# Patient Record
Sex: Female | Born: 1950
Health system: Southern US, Community
[De-identification: ages and names within clinical notes are randomized; demographics above are authoritative.]

## PROBLEM LIST (undated history)

## (undated) DIAGNOSIS — C50919 Malignant neoplasm of unspecified site of unspecified female breast: Secondary | ICD-10-CM

## (undated) DIAGNOSIS — K589 Irritable bowel syndrome without diarrhea: Secondary | ICD-10-CM

## (undated) DIAGNOSIS — F32A Depression, unspecified: Secondary | ICD-10-CM

## (undated) DIAGNOSIS — J029 Acute pharyngitis, unspecified: Secondary | ICD-10-CM

## (undated) DIAGNOSIS — M1990 Unspecified osteoarthritis, unspecified site: Secondary | ICD-10-CM

## (undated) DIAGNOSIS — R059 Cough, unspecified: Secondary | ICD-10-CM

## (undated) DIAGNOSIS — K219 Gastro-esophageal reflux disease without esophagitis: Secondary | ICD-10-CM

## (undated) DIAGNOSIS — R05 Cough: Secondary | ICD-10-CM

## (undated) DIAGNOSIS — F329 Major depressive disorder, single episode, unspecified: Secondary | ICD-10-CM

## (undated) HISTORY — PX: BREAST LUMPECTOMY: SHX2

## (undated) HISTORY — DX: Unspecified osteoarthritis, unspecified site: M19.90

## (undated) HISTORY — DX: Acute pharyngitis, unspecified: J02.9

## (undated) HISTORY — DX: Cough: R05

## (undated) HISTORY — PX: DILATION AND CURETTAGE OF UTERUS: SHX78

## (undated) HISTORY — DX: Malignant neoplasm of unspecified site of unspecified female breast: C50.919

## (undated) HISTORY — PX: COLONOSCOPY: SHX174

## (undated) HISTORY — DX: Cough, unspecified: R05.9

---

## 1998-04-10 ENCOUNTER — Other Ambulatory Visit: Admission: RE | Admit: 1998-04-10 | Discharge: 1998-04-10 | Payer: Self-pay | Admitting: Family Medicine

## 1999-04-16 ENCOUNTER — Other Ambulatory Visit: Admission: RE | Admit: 1999-04-16 | Discharge: 1999-04-16 | Payer: Self-pay | Admitting: Obstetrics and Gynecology

## 2000-05-19 ENCOUNTER — Other Ambulatory Visit: Admission: RE | Admit: 2000-05-19 | Discharge: 2000-05-19 | Payer: Self-pay | Admitting: Obstetrics and Gynecology

## 2001-06-08 ENCOUNTER — Other Ambulatory Visit: Admission: RE | Admit: 2001-06-08 | Discharge: 2001-06-08 | Payer: Self-pay | Admitting: Obstetrics and Gynecology

## 2002-06-15 ENCOUNTER — Other Ambulatory Visit: Admission: RE | Admit: 2002-06-15 | Discharge: 2002-06-15 | Payer: Self-pay | Admitting: Obstetrics and Gynecology

## 2003-07-02 ENCOUNTER — Other Ambulatory Visit: Admission: RE | Admit: 2003-07-02 | Discharge: 2003-07-02 | Payer: Self-pay | Admitting: Obstetrics and Gynecology

## 2004-07-08 ENCOUNTER — Other Ambulatory Visit: Admission: RE | Admit: 2004-07-08 | Discharge: 2004-07-08 | Payer: Self-pay | Admitting: Obstetrics and Gynecology

## 2004-12-26 HISTORY — PX: FACELIFT: SHX1566

## 2005-01-04 ENCOUNTER — Ambulatory Visit (HOSPITAL_COMMUNITY): Admission: RE | Admit: 2005-01-04 | Discharge: 2005-01-04 | Payer: Self-pay | Admitting: Plastic Surgery

## 2005-07-14 ENCOUNTER — Other Ambulatory Visit: Admission: RE | Admit: 2005-07-14 | Discharge: 2005-07-14 | Payer: Self-pay | Admitting: Obstetrics and Gynecology

## 2009-10-14 ENCOUNTER — Encounter: Admission: RE | Admit: 2009-10-14 | Discharge: 2009-10-14 | Payer: Self-pay | Admitting: Family Medicine

## 2011-10-26 ENCOUNTER — Other Ambulatory Visit: Payer: Self-pay | Admitting: Obstetrics and Gynecology

## 2011-10-26 DIAGNOSIS — R928 Other abnormal and inconclusive findings on diagnostic imaging of breast: Secondary | ICD-10-CM

## 2011-11-02 ENCOUNTER — Other Ambulatory Visit: Payer: Self-pay | Admitting: Obstetrics and Gynecology

## 2011-11-02 ENCOUNTER — Ambulatory Visit
Admission: RE | Admit: 2011-11-02 | Discharge: 2011-11-02 | Disposition: A | Discharge status: Home / Self Care | Payer: BC Managed Care – PPO | Source: Ambulatory Visit | Attending: Obstetrics and Gynecology | Admitting: Obstetrics and Gynecology

## 2011-11-02 DIAGNOSIS — R921 Mammographic calcification found on diagnostic imaging of breast: Secondary | ICD-10-CM

## 2011-11-02 DIAGNOSIS — R928 Other abnormal and inconclusive findings on diagnostic imaging of breast: Secondary | ICD-10-CM

## 2011-11-09 ENCOUNTER — Ambulatory Visit
Admission: RE | Admit: 2011-11-09 | Discharge: 2011-11-09 | Disposition: A | Discharge status: Home / Self Care | Payer: BC Managed Care – PPO | Source: Ambulatory Visit | Attending: Obstetrics and Gynecology | Admitting: Obstetrics and Gynecology

## 2011-11-09 ENCOUNTER — Other Ambulatory Visit: Payer: Self-pay | Admitting: Obstetrics and Gynecology

## 2011-11-09 DIAGNOSIS — R921 Mammographic calcification found on diagnostic imaging of breast: Secondary | ICD-10-CM

## 2011-11-09 DIAGNOSIS — Z9889 Other specified postprocedural states: Secondary | ICD-10-CM

## 2011-11-10 ENCOUNTER — Ambulatory Visit
Admission: RE | Admit: 2011-11-10 | Discharge: 2011-11-10 | Disposition: A | Discharge status: Home / Self Care | Payer: BC Managed Care – PPO | Source: Ambulatory Visit | Attending: Obstetrics and Gynecology | Admitting: Obstetrics and Gynecology

## 2011-11-10 DIAGNOSIS — Z9889 Other specified postprocedural states: Secondary | ICD-10-CM

## 2011-11-16 ENCOUNTER — Encounter (INDEPENDENT_AMBULATORY_CARE_PROVIDER_SITE_OTHER): Payer: Self-pay | Admitting: General Surgery

## 2011-11-16 ENCOUNTER — Other Ambulatory Visit (INDEPENDENT_AMBULATORY_CARE_PROVIDER_SITE_OTHER): Payer: Self-pay | Admitting: General Surgery

## 2011-11-16 ENCOUNTER — Ambulatory Visit (INDEPENDENT_AMBULATORY_CARE_PROVIDER_SITE_OTHER): Payer: BC Managed Care – PPO | Admitting: General Surgery

## 2011-11-16 VITALS — BP 122/80 | HR 68 | Temp 97.9°F | Resp 18 | Ht 63.25 in | Wt 140.2 lb

## 2011-11-16 DIAGNOSIS — D4861 Neoplasm of uncertain behavior of right breast: Secondary | ICD-10-CM

## 2011-11-16 DIAGNOSIS — D4860 Neoplasm of uncertain behavior of unspecified breast: Secondary | ICD-10-CM

## 2011-11-16 NOTE — Patient Instructions (Signed)
You have a small area of calcifications in the upper outer quadrant of the right breast, and the biopsy shows atypical ductal hyperplasia.  You will be scheduled for a conservative biopsy of this area.  Breast Biopsy WHY YOU NEED A BIOPSY Your caregiver has recommended that you have a breast tissue sample taken (biopsy). This is done to be certain that the lump or abnormality found in your breast is not cancerous (malignant). During a biopsy, a small piece of tissue is removed, so it can be examined under a microscope by a specialist (pathologist) who looks at tissues and cells and diagnoses abnormalities in them. Most lumps (tumors) or abnormalities, on or in the breast, are not cancerous (benign). However, biopsies are taken when your caregiver cannot be absolutely certain of what is wrong only from doing a physical exam, mammogram (breast X-ray), or other studies. A breast biopsy can tell you whether nothing more needs to be done, or you need more surgery or another type of treatment. A biopsy is done when there is:  Any undiagnosed breast mass.   Nipple abnormalities, dimpling, crusting, or ulcerations.   Calcium deposits (calcifications) or abnormalities seen on your mammogram, ultrasound, or MRI.   Suspicious changes in the breast (thickening, asymmetry) seen on mammogram.   Abnormal discharge from the nipple, especially blood.   Redness, swelling, and pain of the breast.  HOW A BIOPSY IS PERFORMED A biopsy is often performed on an outpatient basis (you go home the same day). This can be done in a hospital, clinic, or surgical center. Tissue samples (biopsies) are often done under local anesthesia (area is numbed). Sometimes general anesthetics are required, in which case you sleep through the procedure. Biopsies may remove the entire lump, a small piece of the lump, or a small sliver of tissue removed by needle. TYPES OF BREAST BIOPSY  Fine needle aspiration. A thin needle is placed  through the skin, to the lump or cyst, and cells are removed.   Core needle biopsy. A large needle with a special tip is placed through the skin, to the abnormality, and a piece of tissue is removed.   Stereotactic biopsy. A core needle with a special X-ray is used, to direct the needle to the lump or abnormal area, which is difficult to feel or cannot be felt.   Vacuum-assisted biopsy. A hollow probe and a gentle vacuum remove a sample of tissue.   Ultrasound guided core needle biopsy. You lie on your stomach, with your breast through an opening, and a high frequency ultrasound helps guide the needle to the area of the abnormality.   Open biopsy. An incision is made in the breast, and a piece of the lump or the whole lump is removed.  LET YOUR CAREGIVER KNOW ABOUT:  Allergies.   Medicines taken, including herbs, eye drops, over-the-counter medicines, and creams.   Use of steroids (by mouth or creams).   Previous problems with anesthetics or Novocaine.   If you are taking aspirin or blood thinners.   Possibility of pregnancy, if this applies.   History of blood clots (thrombophlebitis).   History of bleeding or blood problems.   Previous surgery.   Other health problems.  RISKS AND COMPLICATIONS   Bleeding.   Infection.   Allergy to medicines.   Bruising and swelling of the breast.   Alteration in the shape of the breast.   Not finding the lump or abnormality.   Needing more surgery.  BEFORE THE PROCEDURE  You should arrive 60 minutes prior to your procedure or as directed.   Check-in at the admissions desk, to fill out necessary forms, if you are not preregistered.   There will be consent forms to sign, prior to the procedure.   There is a waiting area for your family, while you are having your biopsy.   Try to have someone with you, to drive you home.   Do not smoke for 2 weeks before the surgery.   Let your caregiver know if you develop a cold or an  infection.   Do not drink alcohol for at least 24 hours before surgery.   Wear a good support bra to the surgery.  AFTER THE PROCEDURE  After surgery, you will be taken to the recovery area, where a nurse will watch and check your progress. Once you are awake, stable, and taking fluids well, if there are no other problems, you will be allowed to go home.   Ice packs applied to your operative site may help with discomfort and keep the swelling down.   You may resume normal diet and activities as directed. Avoid strenuous activities affecting the arm on the side of the biopsy, such as tennis, swimming, heavy lifting (more than 10 pounds) or pulling.   Bruising in the breast is normal following this procedure.   Wearing a support bra, even to bed, may be more comfortable. The bra will also help keep the dressing on.   Change dressings as directed.   Your doctor may apply a pressure dressing on your breast for 24 to 48 hours.   Only take over-the-counter or prescription medicines for pain, discomfort, or fever as directed by your caregiver.   Do not take aspirin, because it can cause bleeding.  HOME CARE INSTRUCTIONS   You may resume your usual diet.   Have someone drive you home after the surgery.   Do not do any exercise, driving, lifting or general activities without your caregiver's permission.   Take medicines and over-the-counter medicines, as ordered by your caregiver.   Keep your postoperative appointments as recommended.   Do not drink alcohol while taking pain medicine.  Finding out the results of your test Not all test results are available during your visit. If your test results are not back during the visit, make an appointment with your caregiver to find out the results. Do not assume everything is normal if you have not heard from your caregiver or the medical facility. It is important for you to follow up on all of your test results.  SEEK MEDICAL CARE IF:   You  notice redness, swelling, or increasing pain in the wound.   You notice a bad smell coming from the wound or dressing.   You develop a rash.   You need stronger pain medicine.   You are having an allergic reaction or problems with your medicines.  SEEK IMMEDIATE MEDICAL CARE IF:   You have difficulty breathing.   You have a fever.   There is increased bleeding (more than a small spot) from the wound.   Pus is coming from the wound.   The wound is breaking open.  Document Released: 09/13/2005 Document Revised: 05/26/2011 Document Reviewed: 08/01/2009 [facility] Patient Information 2012 [place], [place].

## 2011-11-16 NOTE — Progress Notes (Signed)
Patient ID: Patricia Hale, female   DOB: June 17, 1951, 61 y.o.   MRN: [id number]  Chief Complaint  Patient presents with  . New Evaluation    Right adhesions     HPI Patricia Hale is a 61 y.o. female.  She is referred by Dr. Norva Pavlov at the breast [facility] for consideration of excision microcalcifications upper outer quadrant right breast.  The patient does not have any history of breast problems. Routine screening mammograms recently showed a small area of focal microcalcifications in the upper outer quadrant of the right breast. There is no mass effect. Left breast looked fine. 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 She was referred for excision of this area.  Family history reveals breast cancer in a paternal aunt only. There is no family history of ovarian cancer.  The patient does take hormone replacement therapy. She is a smoker. She is employed as a Interior and spatial designer. HPI  Past Medical History  Diagnosis Date  . Arthritis     osteo  . Cough   . Sore throat     slight per patient history form dated 11/16/2011    Past Surgical History  Procedure Date  . Facelift 12/2004    Family History  Problem Relation Age of Onset  . Cancer Father   . Cancer Paternal Aunt     breast    Social History History  Substance Use Topics  . Smoking status: Current Everyday Smoker -- 0.5 packs/day    Types: Cigarettes  . Smokeless tobacco: Never Used  . Alcohol Use: Yes     2 glasses per day    No Known Allergies  Current Outpatient Prescriptions  Medication Sig Dispense Refill  . escitalopram (LEXAPRO) 10 MG tablet Take 10 mg by mouth daily.      Marland Kitchen estradiol-norethindrone (MIMVEY) 1-0.5 MG per tablet Take 1 tablet by mouth daily.      . hyoscyamine (LEVSIN, ANASPAZ) 0.125 MG tablet Take 0.125 mg by mouth daily.        Review of Systems Review of Systems  Constitutional: Negative for fever, chills and unexpected weight change.  HENT: Negative  for hearing loss, congestion, sore throat, trouble swallowing and voice change.   Eyes: Negative for visual disturbance.  Respiratory: Negative for cough and wheezing.   Cardiovascular: Negative for chest pain, palpitations and leg swelling.  Gastrointestinal: Negative for nausea, vomiting, abdominal pain, diarrhea, constipation, blood in stool, abdominal distention and anal bleeding.  Genitourinary: Negative for hematuria, vaginal bleeding and difficulty urinating.  Musculoskeletal: Negative for arthralgias.  Skin: Negative for rash and wound.  Neurological: Negative for seizures, syncope and headaches.  Hematological: Negative for adenopathy. Does not bruise/bleed easily.  Psychiatric/Behavioral: Negative for confusion.    Blood pressure 122/80, pulse 68, temperature 97.9 F (36.6 C), temperature source Temporal, resp. rate 18, height 5' 3.25" (1.607 m), weight 140 lb 3.2 oz (63.594 kg).  Physical Exam Physical Exam  Constitutional: She is oriented to person, place, and time. She appears well-developed and well-nourished. No distress.  HENT:  Head: Normocephalic and atraumatic.  Nose: Nose normal.  Mouth/Throat: No oropharyngeal exudate.  Eyes: Conjunctivae and EOM are normal. Pupils are equal, round, and reactive to light. Left eye exhibits no discharge. No scleral icterus.  Neck: Neck supple. No JVD present. No tracheal deviation present. No thyromegaly present.  Cardiovascular: Normal rate, regular rhythm, normal heart sounds and intact distal pulses.   No murmur heard. Pulmonary/Chest: Effort normal and breath sounds normal. No respiratory  distress. She has no wheezes. She has no rales. She exhibits no tenderness.       Small bruise and biopsy site upper outer quadrant right breast. No mass in either breast. No other skin change. Nipples normal. No axillary adenopathy.  Abdominal: Soft. Bowel sounds are normal. She exhibits no distension and no mass. There is no tenderness. There  is no rebound and no guarding.  Musculoskeletal: She exhibits no edema and no tenderness.  Lymphadenopathy:    She has no cervical adenopathy.  Neurological: She is alert and oriented to person, place, and time. She exhibits normal muscle tone. Coordination normal.  Skin: Skin is warm. No rash noted. She is not diaphoretic. No erythema. No pallor.  Psychiatric: She has a normal mood and affect. Her behavior is normal. Judgment and thought content normal.    Data Reviewed I reviewed the mammograms and biopsy report.  Assessment    Atypical ductal hyperplasia right breast, upper outer quadrant. Small focal area of microcalcifications.    Plan    We'll schedule for right breast biopsy with needle localization and specimen mammogram in the near future.  I've discussed the indications and details of the surgery with her. Risks and complications have been outlined, including but limited to bleeding, infection, cosmetic deformity, nerve damage with chronic pain, reoperation for positive margins or if cancer is found, cardiac, pulmonary, and thromboembolic problems. She understands the issues. Her questions were answered. She agrees with this plan.       [name]. Derrell Lolling, M.D., [facility] General and Minimally invasive Surgery Breast and Colorectal Surgery Office:   [phone] Pager:   [phone]  11/16/2011, 10:48 AM

## 2011-12-08 ENCOUNTER — Encounter (HOSPITAL_BASED_OUTPATIENT_CLINIC_OR_DEPARTMENT_OTHER): Payer: Self-pay | Admitting: *Deleted

## 2011-12-08 NOTE — Progress Notes (Signed)
No labs needed

## 2011-12-10 NOTE — H&P (Signed)
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   MRN: [id number]          HPI Patricia Hale is a 61 y.o. female.  She is referred by Dr. Norva Pavlov at the breast [facility] for consideration of excision microcalcifications upper outer quadrant right breast.  The patient does not have any history of breast problems. Routine screening mammograms recently showed a small area of focal microcalcifications in the upper outer quadrant of the right breast. There is no mass effect. Left breast looked fine. 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 She was referred for excision of this area.  Family history reveals breast cancer in a paternal aunt only. There is no family history of ovarian cancer.  The patient does take hormone replacement therapy. She is a smoker. She is employed as a Interior and spatial designer.    Past Medical History   Diagnosis  Date   .  Arthritis         osteo   .  Cough     .  Sore throat         slight per patient history form dated 11/16/2011       Past Surgical History   Procedure  Date   .  Facelift  12/2004       Family History   Problem  Relation  Age of Onset   .  Cancer  Father     .  Cancer  Paternal Aunt         breast      Social History History   Substance Use Topics   .  Smoking status:  Current Everyday Smoker -- 0.5 packs/day       Types:  Cigarettes   .  Smokeless tobacco:  Never Used   .  Alcohol Use:  Yes         2 glasses per day       No Known Allergies    Current Outpatient Prescriptions   Medication  Sig  Dispense  Refill   .  escitalopram (LEXAPRO) 10 MG tablet  Take 10 mg by mouth daily.         Marland Kitchen  estradiol-norethindrone (MIMVEY) 1-0.5 MG per tablet  Take 1 tablet by mouth daily.         .  hyoscyamine (LEVSIN, ANASPAZ) 0.125 MG tablet  Take 0.125 mg by mouth daily.            Review of Systems  Constitutional: Negative for fever, chills and unexpected weight change.  HENT: Negative for hearing loss, congestion, sore throat, trouble swallowing and voice change.   Eyes: Negative for visual disturbance.  Respiratory: Negative for cough and wheezing.  Cardiovascular: Negative for chest pain, palpitations and leg swelling.  Gastrointestinal: Negative for nausea, vomiting, abdominal pain, diarrhea, constipation, blood in stool, abdominal distention and anal bleeding.  Genitourinary: Negative for hematuria, vaginal bleeding and difficulty urinating.  Musculoskeletal: Negative for arthralgias.  Skin: Negative for rash and wound.  Neurological: Negative for seizures, syncope and headaches.  Hematological: Negative for adenopathy. Does not bruise/bleed easily.  Psychiatric/Behavioral: Negative for confusion.    Blood pressure 122/80, pulse 68, temperature 97.9 F (36.6 C), temperature source Temporal, resp. rate 18, height 5' 3.25" (1.607 m), weight 140 lb 3.2 oz (63.594 kg).   Physical Exam  Constitutional: She is oriented to person, place, and time. She appears well-developed and well-nourished. No distress.  HENT:   Head: Normocephalic and atraumatic.   Nose: Nose normal.   Mouth/Throat: No oropharyngeal exudate.  Eyes: Conjunctivae and EOM are normal. Pupils are equal, round, and reactive to light. Left eye exhibits no discharge. No scleral icterus.  Neck: Neck supple. No JVD present. No tracheal deviation present. No thyromegaly present.  Cardiovascular: Normal rate, regular rhythm, normal heart  sounds and intact distal pulses.    No murmur heard. Pulmonary/Chest: Effort normal and breath sounds normal. No respiratory distress. She has no wheezes. She has no rales. She exhibits no tenderness.       Small bruise and biopsy site upper outer quadrant right breast. No mass in either breast. No other skin change. Nipples normal. No axillary adenopathy.  Abdominal: Soft. Bowel sounds are normal. She exhibits no distension and no mass. There is no tenderness. There is no rebound and no guarding.  Musculoskeletal: She exhibits no edema and no tenderness.  Lymphadenopathy:    She has no cervical adenopathy.  Neurological: She is alert and oriented to person, place, and time. She exhibits normal muscle tone. Coordination normal.  Skin: Skin is warm. No rash noted. She is not diaphoretic. No erythema. No pallor.  Psychiatric: She has a normal mood and affect. Her behavior is normal. Judgment and thought content normal.     Assessment Atypical ductal hyperplasia right breast, upper outer quadrant. Small focal area of microcalcifications.   Plan We'll schedule for right breast biopsy with needle localization and specimen mammogram in the near future.  I've discussed the indications and details of the surgery with her. Risks and complications have been outlined, including but limited to bleeding, infection, cosmetic deformity, nerve damage with chronic pain, reoperation for positive margins or if cancer is found, cardiac, pulmonary, and thromboembolic problems. She understands the issues. Her questions were answered. She agrees with this plan.       [name]. Derrell Lolling, M.D., [facility] General and Minimally invasive Surgery Breast and Colorectal Surgery Office:   [phone] Pager:   [phone]

## 2011-12-13 ENCOUNTER — Ambulatory Visit
Admission: RE | Admit: 2011-12-13 | Discharge: 2011-12-13 | Disposition: A | Discharge status: Home / Self Care | Payer: BC Managed Care – PPO | Source: Ambulatory Visit | Attending: General Surgery | Admitting: General Surgery

## 2011-12-13 ENCOUNTER — Encounter (HOSPITAL_BASED_OUTPATIENT_CLINIC_OR_DEPARTMENT_OTHER)
Admission: RE | Disposition: A | Discharge status: Home / Self Care | Payer: Self-pay | Source: Ambulatory Visit | Attending: General Surgery

## 2011-12-13 ENCOUNTER — Ambulatory Visit (HOSPITAL_BASED_OUTPATIENT_CLINIC_OR_DEPARTMENT_OTHER)
Admission: RE | Admit: 2011-12-13 | Discharge: 2011-12-13 | Disposition: A | Discharge status: Home / Self Care | Payer: BC Managed Care – PPO | Source: Ambulatory Visit | Attending: General Surgery | Admitting: General Surgery

## 2011-12-13 ENCOUNTER — Encounter (HOSPITAL_BASED_OUTPATIENT_CLINIC_OR_DEPARTMENT_OTHER): Payer: Self-pay | Admitting: Anesthesiology

## 2011-12-13 ENCOUNTER — Ambulatory Visit (HOSPITAL_COMMUNITY): Payer: BC Managed Care – PPO

## 2011-12-13 ENCOUNTER — Encounter (HOSPITAL_BASED_OUTPATIENT_CLINIC_OR_DEPARTMENT_OTHER): Payer: Self-pay

## 2011-12-13 ENCOUNTER — Ambulatory Visit (HOSPITAL_BASED_OUTPATIENT_CLINIC_OR_DEPARTMENT_OTHER): Payer: BC Managed Care – PPO | Admitting: Anesthesiology

## 2011-12-13 DIAGNOSIS — D0590 Unspecified type of carcinoma in situ of unspecified breast: Secondary | ICD-10-CM | POA: Insufficient documentation

## 2011-12-13 DIAGNOSIS — C50919 Malignant neoplasm of unspecified site of unspecified female breast: Secondary | ICD-10-CM

## 2011-12-13 DIAGNOSIS — D4861 Neoplasm of uncertain behavior of right breast: Secondary | ICD-10-CM

## 2011-12-13 DIAGNOSIS — Z9889 Other specified postprocedural states: Secondary | ICD-10-CM

## 2011-12-13 HISTORY — DX: Irritable bowel syndrome, unspecified: K58.9

## 2011-12-13 HISTORY — PX: BREAST SURGERY: SHX581

## 2011-12-13 HISTORY — DX: Major depressive disorder, single episode, unspecified: F32.9

## 2011-12-13 HISTORY — DX: Malignant neoplasm of unspecified site of unspecified female breast: C50.919

## 2011-12-13 HISTORY — DX: Depression, unspecified: F32.A

## 2011-12-13 SURGERY — PARTIAL MASTECTOMY WITH NEEDLE LOCALIZATION
Anesthesia: General | Site: Breast | Laterality: Right | Wound class: Clean

## 2011-12-13 MED ORDER — HYDROMORPHONE HCL PF 1 MG/ML IJ SOLN
0.2500 mg | INTRAMUSCULAR | Status: DC | PRN
Start: 2011-12-13 — End: 2011-12-13

## 2011-12-13 MED ORDER — FENTANYL CITRATE 0.05 MG/ML IJ SOLN
INTRAMUSCULAR | Status: DC | PRN
  Administered 2011-12-13: 50 ug via INTRAVENOUS

## 2011-12-13 MED ORDER — MIDAZOLAM HCL 5 MG/5ML IJ SOLN
INTRAMUSCULAR | Status: DC | PRN
  Administered 2011-12-13: 2 mg via INTRAVENOUS

## 2011-12-13 MED ORDER — ONDANSETRON HCL 4 MG/2ML IJ SOLN
INTRAMUSCULAR | Status: DC | PRN
  Administered 2011-12-13: 4 mg via INTRAVENOUS

## 2011-12-13 MED ORDER — ONDANSETRON HCL 4 MG/2ML IJ SOLN: 4.0000 mg | Freq: Four times a day (QID) | INTRAMUSCULAR | Status: DC | PRN

## 2011-12-13 MED ORDER — SODIUM CHLORIDE 0.9 % IV SOLN: INTRAVENOUS | Status: DC

## 2011-12-13 MED ORDER — ACETAMINOPHEN 325 MG PO TABS: 650.0000 mg | ORAL_TABLET | ORAL | Status: DC | PRN

## 2011-12-13 MED ORDER — CHLORHEXIDINE GLUCONATE 4 % EX LIQD: 1.0000 "application " | Freq: Once | CUTANEOUS | Status: DC

## 2011-12-13 MED ORDER — MIDAZOLAM HCL 2 MG/2ML IJ SOLN: 1.0000 mg | INTRAMUSCULAR | Status: DC | PRN

## 2011-12-13 MED ORDER — DEXAMETHASONE SODIUM PHOSPHATE 4 MG/ML IJ SOLN
INTRAMUSCULAR | Status: DC | PRN
  Administered 2011-12-13: 10 mg via INTRAVENOUS

## 2011-12-13 MED ORDER — LIDOCAINE HCL (CARDIAC) 20 MG/ML IV SOLN
INTRAVENOUS | Status: DC | PRN
  Administered 2011-12-13: 80 mg via INTRAVENOUS

## 2011-12-13 MED ORDER — CEFAZOLIN SODIUM 1-5 GM-% IV SOLN
1.0000 g | INTRAVENOUS | Status: AC
  Administered 2011-12-13: 1 g via INTRAVENOUS

## 2011-12-13 MED ORDER — OXYCODONE-ACETAMINOPHEN 10-325 MG PO TABS: 1.0000 | ORAL_TABLET | ORAL | Status: AC | PRN

## 2011-12-13 MED ORDER — SODIUM CHLORIDE 0.9 % IJ SOLN: 3.0000 mL | INTRAMUSCULAR | Status: DC | PRN

## 2011-12-13 MED ORDER — LACTATED RINGERS IV SOLN
INTRAVENOUS | Status: DC
  Administered 2011-12-13 (×2): via INTRAVENOUS

## 2011-12-13 MED ORDER — BUPIVACAINE-EPINEPHRINE 0.5% -1:200000 IJ SOLN
INTRAMUSCULAR | Status: DC | PRN
  Administered 2011-12-13: 20 mL

## 2011-12-13 MED ORDER — PROPOFOL 10 MG/ML IV EMUL
INTRAVENOUS | Status: DC | PRN
  Administered 2011-12-13: 200 mg via INTRAVENOUS

## 2011-12-13 MED ORDER — FENTANYL CITRATE 0.05 MG/ML IJ SOLN: 50.0000 ug | INTRAMUSCULAR | Status: DC | PRN

## 2011-12-13 MED ORDER — SODIUM CHLORIDE 0.9 % IJ SOLN: 3.0000 mL | Freq: Two times a day (BID) | INTRAMUSCULAR | Status: DC

## 2011-12-13 MED ORDER — OXYCODONE HCL 5 MG PO TABS: 5.0000 mg | ORAL_TABLET | ORAL | Status: DC | PRN

## 2011-12-13 MED ORDER — EPHEDRINE SULFATE 50 MG/ML IJ SOLN
INTRAMUSCULAR | Status: DC | PRN
  Administered 2011-12-13: 5 mg via INTRAVENOUS

## 2011-12-13 MED ORDER — MORPHINE SULFATE 2 MG/ML IJ SOLN: 2.0000 mg | INTRAMUSCULAR | Status: DC | PRN

## 2011-12-13 MED ORDER — SODIUM CHLORIDE 0.9 % IV SOLN: 250.0000 mL | INTRAVENOUS | Status: DC | PRN

## 2011-12-13 MED ORDER — ACETAMINOPHEN 650 MG RE SUPP: 650.0000 mg | RECTAL | Status: DC | PRN

## 2011-12-13 MED ORDER — LORAZEPAM 2 MG/ML IJ SOLN: 1.0000 mg | Freq: Once | INTRAMUSCULAR | Status: DC | PRN

## 2011-12-13 SURGICAL SUPPLY — 56 items
BANDAGE ELASTIC 6 VELCRO ST LF (GAUZE/BANDAGES/DRESSINGS) IMPLANT
BENZOIN TINCTURE PRP APPL 2/3 (GAUZE/BANDAGES/DRESSINGS) IMPLANT
BLADE HEX COATED 2.75 (ELECTRODE) ×2 IMPLANT
BLADE SURG 15 STRL LF DISP TIS (BLADE) ×2 IMPLANT
BLADE SURG 15 STRL SS (BLADE) ×2
CANISTER SUCTION 1200CC (MISCELLANEOUS) ×2 IMPLANT
CHLORAPREP W/TINT 26ML (MISCELLANEOUS) ×2 IMPLANT
CLOTH BEACON ORANGE TIMEOUT ST (SAFETY) ×2 IMPLANT
COVER MAYO STAND STRL (DRAPES) ×2 IMPLANT
COVER TABLE BACK 60X90 (DRAPES) ×2 IMPLANT
DECANTER SPIKE VIAL GLASS SM (MISCELLANEOUS) IMPLANT
DERMABOND ADVANCED (GAUZE/BANDAGES/DRESSINGS) ×1
DERMABOND ADVANCED .7 DNX12 (GAUZE/BANDAGES/DRESSINGS) ×1 IMPLANT
DEVICE DUBIN W/COMP PLATE 8390 (MISCELLANEOUS) ×2 IMPLANT
DRAPE LAPAROTOMY TRNSV 102X78 (DRAPE) IMPLANT
DRAPE PED LAPAROTOMY (DRAPES) ×2 IMPLANT
DRAPE UTILITY XL STRL (DRAPES) ×2 IMPLANT
ELECT REM PT RETURN 9FT ADLT (ELECTROSURGICAL) ×2
ELECTRODE REM PT RTRN 9FT ADLT (ELECTROSURGICAL) ×1 IMPLANT
GAUZE SPONGE 4X4 12PLY STRL LF (GAUZE/BANDAGES/DRESSINGS) IMPLANT
GAUZE SPONGE 4X4 16PLY XRAY LF (GAUZE/BANDAGES/DRESSINGS) IMPLANT
GLOVE BIO SURGEON STRL SZ 6.5 (GLOVE) ×2 IMPLANT
GLOVE BIOGEL PI IND STRL 7.0 (GLOVE) ×1 IMPLANT
GLOVE BIOGEL PI IND STRL 8 (GLOVE) ×1 IMPLANT
GLOVE BIOGEL PI INDICATOR 7.0 (GLOVE) ×1
GLOVE BIOGEL PI INDICATOR 8 (GLOVE) ×1
GLOVE EUDERMIC 7 POWDERFREE (GLOVE) ×2 IMPLANT
GLOVE SURG SS PI 8.0 STRL IVOR (GLOVE) ×2 IMPLANT
GOWN PREVENTION PLUS XLARGE (GOWN DISPOSABLE) ×2 IMPLANT
GOWN PREVENTION PLUS XXLARGE (GOWN DISPOSABLE) ×4 IMPLANT
KIT MARKER MARGIN INK (KITS) ×2 IMPLANT
NEEDLE HYPO 22GX1.5 SAFETY (NEEDLE) IMPLANT
NEEDLE HYPO 25X1 1.5 SAFETY (NEEDLE) ×2 IMPLANT
NS IRRIG 1000ML POUR BTL (IV SOLUTION) ×2 IMPLANT
PACK BASIN DAY SURGERY FS (CUSTOM PROCEDURE TRAY) ×2 IMPLANT
PENCIL BUTTON HOLSTER BLD 10FT (ELECTRODE) ×2 IMPLANT
SLEEVE SCD COMPRESS KNEE MED (MISCELLANEOUS) ×2 IMPLANT
SPONGE LAP 4X18 X RAY DECT (DISPOSABLE) ×2 IMPLANT
STRIP CLOSURE SKIN 1/2X4 (GAUZE/BANDAGES/DRESSINGS) IMPLANT
SUT ETHILON 4 0 PS 2 18 (SUTURE) IMPLANT
SUT MON AB 4-0 PC3 18 (SUTURE) ×2 IMPLANT
SUT SILK 2 0 SH (SUTURE) ×2 IMPLANT
SUT VIC AB 2-0 SH 27 (SUTURE)
SUT VIC AB 2-0 SH 27XBRD (SUTURE) IMPLANT
SUT VIC AB 3-0 FS2 27 (SUTURE) IMPLANT
SUT VIC AB 4-0 P-3 18XBRD (SUTURE) IMPLANT
SUT VIC AB 4-0 P3 18 (SUTURE)
SUT VICRYL 3-0 CR8 SH (SUTURE) ×2 IMPLANT
SUT VICRYL 4-0 PS2 18IN ABS (SUTURE) IMPLANT
SYR BULB 3OZ (MISCELLANEOUS) ×2 IMPLANT
SYR CONTROL 10ML LL (SYRINGE) ×2 IMPLANT
TAPE HYPAFIX 4 X10 (GAUZE/BANDAGES/DRESSINGS) IMPLANT
TOWEL OR NON WOVEN STRL DISP B (DISPOSABLE) ×2 IMPLANT
TUBE CONNECTING 20X1/4 (TUBING) ×2 IMPLANT
WATER STERILE IRR 1000ML POUR (IV SOLUTION) IMPLANT
YANKAUER SUCT BULB TIP NO VENT (SUCTIONS) ×2 IMPLANT

## 2011-12-13 NOTE — Interval H&P Note (Signed)
History and Physical Interval Note:  12/13/2011 9:15 AM  Patricia Hale  has presented today for surgery, with the diagnosis of atypical hyperplasia right breast  The goals of treatment and the various methods of treatment have been discussed with the patient and family. After consideration of risks, benefits and other options for treatment, the patient has consented to  Procedure(s) (LRB): PARTIAL MASTECTOMY WITH NEEDLE LOCALIZATION (Right) as a surgical intervention .  The patients' history has been reviewed, patient examined, no change in status, stable for surgery.  I have reviewed the patients' chart and labs.  Questions were answered to the patient's satisfaction.     Ernestene Mention

## 2011-12-13 NOTE — Anesthesia Procedure Notes (Signed)
Procedure Name: LMA Insertion Date/Time: 12/13/2011 9:33 AM Performed by: Burna Cash Pre-anesthesia Checklist: Patient identified, Emergency Drugs available, Suction available and Patient being monitored Patient Re-evaluated:Patient Re-evaluated prior to inductionOxygen Delivery Method: Circle System Utilized Preoxygenation: Pre-oxygenation with 100% oxygen Intubation Type: IV induction Ventilation: Mask ventilation without difficulty LMA: LMA inserted LMA Size: 4.0 Number of attempts: 1 Airway Equipment and Method: bite block Placement Confirmation: positive ETCO2 Tube secured with: Tape Dental Injury: Teeth and Oropharynx as per pre-operative assessment

## 2011-12-13 NOTE — Op Note (Signed)
Patient Name:           Patricia Hale   Date of Surgery:        12/13/2011  Pre op Diagnosis:      Atypical ductal hyperplasia right breast, upper outer quadrant  Post op Diagnosis:    same  Procedure:                 Right partial mastectomy with needle localization  Surgeon:                     [name]. Derrell Lolling, M.D., FACS  Assistant:                      none  Operative Indications:   The patient does not have any history of breast problems. Routine screening mammograms recently showed a small area of focal microcalcifications in the upper outer quadrant of the right breast. There is no mass effect. Left breast looked fine. 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 She was referred for excision of this area. There is no palpable abnormality.   Operative Findings:       The breast tissue that was excised looked basically normal. The specimen mammogram showed the wire and a marker clip in the center of the specimen.  Procedure in Detail:          Following the induction of general LMA anesthesia the patient's right breast was prepped and draped in a sterile fashion. Intravenous antibiotics were given. Surgical time out was performed. The mammograms and the wire localization films were reviewed. 0.5% Marcaine with epinephrine was used as a local infiltration anesthetic. A curvilinear incision was made in the upper outer quadrant of the right breast paralleling the areolar margin. Dissection was carried down into the breast tissue and around the localizing wire. I removed a 3 cm diameter specimen. The specimen was removed and marked with the 6 color ink kit. Marland Kitchen Specimen mammogram confirmed that the wire and the marker clip were within the center of the specimen. The specimen was sent to pathology. Hemostasis was excellent and achieved with electrocautery. The breast tissue were closed in 2 layers with interrupted sutures of 3-0 Vicryl. The skin was closed with a running subcuticular  suture of 4-0 Monocryl and Dermabond. The patient was taken to recovery room in stable condition. EBL 10 cc. Complications none. Counts correct.     [name]. Derrell Lolling, M.D., FACS General and Minimally Invasive Surgery Breast and Colorectal Surgery  12/13/2011 10:13 AM

## 2011-12-13 NOTE — Discharge Instructions (Signed)
[facility] Office Phone Number [phone]  BREAST BIOPSY/ PARTIAL MASTECTOMY: POST OP INSTRUCTIONS  Always review your discharge instruction sheet given to you by the facility where your surgery was performed.  IF YOU HAVE DISABILITY OR FAMILY LEAVE FORMS, YOU MUST BRING THEM TO THE OFFICE FOR PROCESSING.  DO NOT GIVE THEM TO YOUR DOCTOR.  1. A prescription for pain medication may be given to you upon discharge.  Take your pain medication as prescribed, if needed.  If narcotic pain medicine is not needed, then you may take acetaminophen (Tylenol) or ibuprofen (Advil) as needed. 2. Take your usually prescribed medications unless otherwise directed 3. If you need a refill on your pain medication, please contact your pharmacy.  They will contact our office to request authorization.  Prescriptions will not be filled after 5pm or on week-ends. 4. You should eat very light the first 24 hours after surgery, such as soup, crackers, pudding, etc.  Resume your normal diet the day after surgery. 5. Most patients will experience some swelling and bruising in the breast.  Ice packs and a good support bra will help.  Swelling and bruising can take several days to resolve.  6. It is common to experience some constipation if taking pain medication after surgery.  Increasing fluid intake and taking a stool softener will usually help or prevent this problem from occurring.  A mild laxative (Milk of Magnesia or Miralax) should be taken according to package directions if there are no bowel movements after 48 hours. 7. Unless discharge instructions indicate otherwise, you may remove your bandages 24-48 hours after surgery, and you may shower at that time.  You may have steri-strips (small skin tapes) in place directly over the incision.  These strips should be left on the skin for 7-10 days.  If your surgeon used skin glue on the incision, you may shower in 24 hours.  The glue will flake off over the  next 2-3 weeks.  Any sutures or staples will be removed at the office during your follow-up visit. 8. ACTIVITIES:  You may resume regular daily activities (gradually increasing) beginning the next day.  Wearing a good support bra or sports bra minimizes pain and swelling.  You may have sexual intercourse when it is comfortable. a. You may drive when you no longer are taking prescription pain medication, you can comfortably wear a seatbelt, and you can safely maneuver your car and apply brakes. b. RETURN TO WORK:  ______________________________________________________________________________________ 9. You should see your doctor in the office for a follow-up appointment approximately two weeks after your surgery.  Your doctor's nurse will typically make your follow-up appointment when she calls you with your pathology report.  Expect your pathology report 2-3 business days after your surgery.  You may call to check if you do not hear from us after three days. 10. OTHER INSTRUCTIONS: _______________________________________________________________________________________________ _____________________________________________________________________________________________________________________________________ _____________________________________________________________________________________________________________________________________ _____________________________________________________________________________________________________________________________________  WHEN TO CALL YOUR DOCTOR: 1. Fever over 101.0 2. Nausea and/or vomiting. 3. Extreme swelling or bruising. 4. Continued bleeding from incision. 5. Increased pain, redness, or drainage from the incision.  The clinic staff is available to answer your questions during regular business hours.  Please don't hesitate to call and ask to speak to one of the nurses for clinical concerns.  If you have a medical emergency, go to the nearest  emergency room or call 911.  A surgeon from [facility] is always on call at the hospital.  For further questions, please visit [email or web address]    [facility]  [address] [place], [place] [address] [phone]   Post Anesthesia Home Care Instructions  Activity: Get plenty of rest for the remainder of the day. A responsible adult should stay with you for 24 hours following the procedure.  For the next 24 hours, DO NOT: -Drive a car -Operate machinery -Drink alcoholic beverages -Take any medication unless instructed by your physician -Make any legal decisions or sign important papers.  Meals: Start with liquid foods such as gelatin or soup. Progress to regular foods as tolerated. Avoid greasy, spicy, heavy foods. If nausea and/or vomiting occur, drink only clear liquids until the nausea and/or vomiting subsides. Call your physician if vomiting continues.  Special Instructions/Symptoms: Your throat may feel dry or sore from the anesthesia or the breathing tube placed in your throat during surgery. If this causes discomfort, gargle with warm salt water. The discomfort should disappear within 24 hours.   

## 2011-12-13 NOTE — Transfer of Care (Signed)
Immediate Anesthesia Transfer of Care Note  Patient: Patricia Hale  Procedure(s) Performed: Procedure(s) (LRB): PARTIAL MASTECTOMY WITH NEEDLE LOCALIZATION (Right)  Patient Location: PACU  Anesthesia Type: General  Level of Consciousness: sedated  Airway & Oxygen Therapy: Patient Spontanous Breathing and Patient connected to face mask oxygen  Post-op Assessment: Report given to PACU RN and Post -op Vital signs reviewed and stable  Post vital signs: Reviewed and stable  Complications: No apparent anesthesia complications

## 2011-12-13 NOTE — Anesthesia Postprocedure Evaluation (Signed)
  Anesthesia Post-op Note  Patient: Patricia Hale  Procedure(s) Performed: Procedure(s) (LRB): PARTIAL MASTECTOMY WITH NEEDLE LOCALIZATION (Right)  Patient Location: PACU  Anesthesia Type: General  Level of Consciousness: awake  Airway and Oxygen Therapy: Patient Spontanous Breathing  Post-op Pain: mild  Post-op Assessment: Post-op Vital signs reviewed, Patient's Cardiovascular Status Stable, Respiratory Function Stable, Patent Airway, No signs of Nausea or vomiting and Pain level controlled  Post-op Vital Signs: Reviewed and stable  Complications: No apparent anesthesia complications

## 2011-12-13 NOTE — Anesthesia Preprocedure Evaluation (Signed)
Anesthesia Evaluation  Patient identified by MRN, date of birth, ID band Patient awake    Reviewed: Allergy & Precautions, H&P , NPO status , Patient's Chart, lab work & pertinent test results  Airway Mallampati: I TM Distance: >3 FB Neck ROM: Full    Dental   Pulmonary Recent URI , Residual Cough,  breath sounds clear to auscultation  Pulmonary exam normal       Cardiovascular     Neuro/Psych Depression    GI/Hepatic   Endo/Other    Renal/GU      Musculoskeletal   Abdominal   Peds  Hematology   Anesthesia Other Findings   Reproductive/Obstetrics                           Anesthesia Physical Anesthesia Plan  ASA: II  Anesthesia Plan: General   Post-op Pain Management:    Induction: Intravenous  Airway Management Planned: LMA  Additional Equipment:   Intra-op Plan:   Post-operative Plan: Extubation in OR  Informed Consent: I have reviewed the patients History and Physical, chart, labs and discussed the procedure including the risks, benefits and alternatives for the proposed anesthesia with the patient or authorized representative who has indicated his/her understanding and acceptance.     Plan Discussed with: CRNA and Surgeon  Anesthesia Plan Comments:         Anesthesia Quick Evaluation

## 2011-12-15 ENCOUNTER — Telehealth (INDEPENDENT_AMBULATORY_CARE_PROVIDER_SITE_OTHER): Payer: Self-pay | Admitting: General Surgery

## 2011-12-15 ENCOUNTER — Other Ambulatory Visit (INDEPENDENT_AMBULATORY_CARE_PROVIDER_SITE_OTHER): Payer: Self-pay | Admitting: General Surgery

## 2011-12-15 DIAGNOSIS — D0510 Intraductal carcinoma in situ of unspecified breast: Secondary | ICD-10-CM

## 2011-12-15 NOTE — Telephone Encounter (Signed)
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  I discussed this with patient by phone. Will schedule breast MRI and follow-up with me. She is aware that she will likely need further treatment.

## 2011-12-17 ENCOUNTER — Telehealth (INDEPENDENT_AMBULATORY_CARE_PROVIDER_SITE_OTHER): Payer: Self-pay

## 2011-12-17 NOTE — Telephone Encounter (Signed)
Pt home doing well. Pt advised of mri date and po appt date.

## 2011-12-22 ENCOUNTER — Ambulatory Visit
Admission: RE | Admit: 2011-12-22 | Discharge: 2011-12-22 | Disposition: A | Discharge status: Home / Self Care | Payer: BC Managed Care – PPO | Source: Ambulatory Visit | Attending: General Surgery | Admitting: General Surgery

## 2011-12-22 DIAGNOSIS — D0510 Intraductal carcinoma in situ of unspecified breast: Secondary | ICD-10-CM

## 2011-12-22 MED ORDER — GADOBENATE DIMEGLUMINE 529 MG/ML IV SOLN
13.0000 mL | Freq: Once | INTRAVENOUS | Status: AC | PRN
  Administered 2011-12-22: 13 mL via INTRAVENOUS

## 2011-12-27 ENCOUNTER — Telehealth (INDEPENDENT_AMBULATORY_CARE_PROVIDER_SITE_OTHER): Payer: Self-pay

## 2011-12-27 NOTE — Telephone Encounter (Signed)
Pt notified of MRI result and given po appt.

## 2012-01-04 ENCOUNTER — Encounter (INDEPENDENT_AMBULATORY_CARE_PROVIDER_SITE_OTHER): Payer: BC Managed Care – PPO | Admitting: General Surgery

## 2012-02-04 ENCOUNTER — Encounter (INDEPENDENT_AMBULATORY_CARE_PROVIDER_SITE_OTHER): Payer: Self-pay | Admitting: General Surgery

## 2012-02-04 ENCOUNTER — Ambulatory Visit (INDEPENDENT_AMBULATORY_CARE_PROVIDER_SITE_OTHER): Payer: BC Managed Care – PPO | Admitting: General Surgery

## 2012-02-04 ENCOUNTER — Other Ambulatory Visit (INDEPENDENT_AMBULATORY_CARE_PROVIDER_SITE_OTHER): Payer: Self-pay

## 2012-02-04 VITALS — BP 100/68 | HR 86 | Temp 99.4°F | Resp 16 | Ht 63.5 in | Wt 138.6 lb

## 2012-02-04 DIAGNOSIS — D0510 Intraductal carcinoma in situ of unspecified breast: Secondary | ICD-10-CM

## 2012-02-04 DIAGNOSIS — D0591 Unspecified type of carcinoma in situ of right breast: Secondary | ICD-10-CM

## 2012-02-04 NOTE — Patient Instructions (Signed)
Your recent right breast partial mastectomy final pathology report shows low-grade ductal carcinoma in situ. Margins are negative and so no further surgery will be necessary. Hormone receptors are strongly positive.  You are advised to discontinue all hormone replacement therapy now.  You will be referred to medical oncology and radiation oncology for discussions about the advantages and disadvantages of radiation therapy and antiestrogen therapy.  Return to see Dr. Derrell Lolling in 2 months.

## 2012-02-04 NOTE — Progress Notes (Signed)
Subjective:     Patient ID: Patricia Hale, female   DOB: 11-10-1950, 61 y.o.   MRN: [id number]  HPI This 61 year old Caucasian female underwent right partial mastectomy with needle localization on December 13, 2011. 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 Final pathology showed low-grade ductal carcinoma in situ, 2 mm tumor, margins are negative, receptors are strongly positive.  I discussed all this with the patient on the phone on March 20. On March 28 she underwent bilateral MRI which shows this is a solitary finding.  She had an appointment with me on April 9, and she she canceled that appointment so that she could take care of her mother who was having surgery in [facility]. Her mother is doing well now.  Family history is negative except for an aunt  with breast cancer in her 54s.  She has not seen medical oncology or radiation oncology yet.  She is not having a problem with her breast.  Review of Systems     Objective:   Physical Exam Patient looks well. Good spirits. No distress. Her  right breast shows the incision the upper outer quadrant is healing very nicely. No complications. Breast contour and nipple projection is normal. Symmetry is very good.    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 Doing well postop right partial mastectomy.    Plan:     The patient was advised to discontinue all hormone replacement therapy now.  The patient will be for referred to medical oncology and radiation oncology to see what their opinion is about adjuvant radiation therapy, adjuvant antiestrogen therapy, and whether she needs one or the other or both.  She will return to see me in 2 months  We will plan bilateral mammograms in January 2014.   [name]. Derrell Lolling, M.D., [facility] General and Minimally invasive Surgery Breast and Colorectal Surgery Office:   [phone] Pager:    [phone]

## 2012-02-09 ENCOUNTER — Telehealth: Payer: Self-pay | Admitting: *Deleted

## 2012-02-09 NOTE — Telephone Encounter (Signed)
Left a message for patient to return my call. 

## 2012-02-11 ENCOUNTER — Telehealth: Payer: Self-pay | Admitting: Oncology

## 2012-02-11 NOTE — Telephone Encounter (Signed)
I left a message to schedule a consult per Dr. Jacinto Halim request.

## 2012-02-14 ENCOUNTER — Telehealth: Payer: Self-pay | Admitting: *Deleted

## 2012-02-14 NOTE — Telephone Encounter (Signed)
Left a message for patient to return my call so I can schedule her to see Med Onc.

## 2012-02-14 NOTE — Telephone Encounter (Signed)
Confirmed 02/22/12 appt w/ pt.  Mailed before appt letter & packet to pt.  Took paperwork to Med Rec to scan.

## 2012-02-15 ENCOUNTER — Encounter: Payer: Self-pay | Admitting: Radiation Oncology

## 2012-02-17 ENCOUNTER — Ambulatory Visit
Admission: RE | Admit: 2012-02-17 | Discharge: 2012-02-17 | Disposition: A | Discharge status: Home / Self Care | Payer: BC Managed Care – PPO | Source: Ambulatory Visit | Attending: Radiation Oncology | Admitting: Radiation Oncology

## 2012-02-17 ENCOUNTER — Encounter: Payer: Self-pay | Admitting: Radiation Oncology

## 2012-02-17 ENCOUNTER — Telehealth: Payer: Self-pay | Admitting: *Deleted

## 2012-02-17 VITALS — BP 118/76 | HR 83 | Temp 98.1°F | Resp 20 | Ht 63.0 in | Wt 136.6 lb

## 2012-02-17 DIAGNOSIS — Z809 Family history of malignant neoplasm, unspecified: Secondary | ICD-10-CM | POA: Insufficient documentation

## 2012-02-17 DIAGNOSIS — F17200 Nicotine dependence, unspecified, uncomplicated: Secondary | ICD-10-CM | POA: Insufficient documentation

## 2012-02-17 DIAGNOSIS — D0590 Unspecified type of carcinoma in situ of unspecified breast: Secondary | ICD-10-CM | POA: Insufficient documentation

## 2012-02-17 DIAGNOSIS — C50919 Malignant neoplasm of unspecified site of unspecified female breast: Secondary | ICD-10-CM

## 2012-02-17 DIAGNOSIS — D0591 Unspecified type of carcinoma in situ of right breast: Secondary | ICD-10-CM

## 2012-02-17 NOTE — Telephone Encounter (Signed)
Left message for patient to return my call to reschedule her.

## 2012-02-17 NOTE — Progress Notes (Signed)
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 female who is seen today for the courtesy of Dr. Derrell Lolling for discussion of possible region therapy following conservative surgery in the management of her low-grade DCIS of the right breast. At the time of a screening mammogram she is found to have a 5 x 3 mm cluster of suspicious microcalcifications within the upper-outer quadrant of the right breast. There were approximately 20 calcifications altogether. Additional views were obtained on 11/02/2011. A stereotactic needle core biopsy on 11/09/2011 revealed atypical ductal hyperplasia with microcalcifications. A clip was placed and it appeared that all calcifications were removed at the time of her stereotactic biopsy. When she returned for wire localization utilizing the clip there were no residual calcifications. Dr. 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 His surgical margin was 0.6 cm. There were associated microcalcifications within the complex sclerosing lesion. Hormone receptor data are pending at the time this dictation.  PREVIOUS RADIATION THERAPY: No   PAST MEDICAL HISTORY:  has a past medical history of Arthritis; Cough; Sore throat; IBS (irritable bowel syndrome); Depression; Breast cancer (12/13/11); and Osteoarthritis.     PAST SURGICAL HISTORY:  Past Surgical History  Procedure Date  . Facelift 12/2004  . Colonoscopy   . Dilation and curettage of uterus   . Breast surgery 12/13/2011   Right Breast Lumpectomy     FAMILY HISTORY: family history includes Cancer in her father and paternal aunt and Diabetes in her cousin and maternal grandmother. Her father died from metastatic cancer of unknown etiology at age 23. Her mother is alive and well at 71.   SOCIAL HISTORY:  reports that she has been smoking Cigarettes.  She has a 10 pack-year smoking history. She has never used smokeless tobacco. She reports that she drinks alcohol. She reports that she does not use illicit drugs. She works as a Interior and spatial designer. Her only child/daughter died in a motor vehicle accident.   ALLERGIES: Review of patient's allergies indicates no known allergies.   MEDICATIONS:  Current Outpatient Prescriptions  Medication Sig Dispense Refill  . escitalopram (LEXAPRO) 10 MG tablet Take 10 mg by mouth daily.      . hyoscyamine (LEVSIN, ANASPAZ) 0.125 MG tablet Take 0.125 mg by mouth daily.         REVIEW OF SYSTEMS:  Pertinent items are noted in HPI.    PHYSICAL EXAM:  height is 5\' 3"  (1.6 m) and weight is 136 lb 9.6 oz (61.961 kg). Her oral temperature is 98.1 F (36.7 C). Her blood pressure is 118/76 and her pulse is 83. Her respiration is 20.   Head and neck examination: Grossly unremarkable. Nodes: Without palpable cervical, supraclavicular, or axillary lymphadenopathy. Chest: Lungs clear. Back: Without spinal or CVA tenderness. Heart: Regular rate rhythm. Breasts: There is a partial mastectomy wound along the upper-outer quadrant of the right breast which is healing well. No masses are appreciated. Left breast without masses or lesions. Abdomen  without hepatomegaly. Extremities without edema.   LABORATORY DATA:  Lab Results  Component Value Date   HGB 14.3 12/13/2011   No results found for this basename: NA, K, CL, CO2   No results found for this basename: ALT, AST, GGT, ALKPHOS, BILITOT      IMPRESSION: Stage 0 (Tis N0 M0)  low-grade DCIS of the right breast. In general terms, explained to  the patient and her husband that her local treatment options include mastectomy versus partial mastectomy with or without radiation therapy. The risk for recurrent disease appears to be related to size of DCIS, nuclear grade, and surgical margins in addition to patient age. The Judith Part prognostic index score is quite low indicating less than 5% chance for local recurrence, and thus minimal if any benefit from adjuvant radiation therapy. We also discussed the role of chemoprevention anti-estrogen therapy, and this can be further discussed with medical oncology consultation. I emphasized the need for her to continue with annual screening mammography, but there is no need for mammography at this time since all the suspicious microcalcifications were removed at the time of her initial core stereotactic biopsy.   PLAN: As discussed above. Continue with annual screening mammography, and follow through with medical oncology consultation.   I spent 60 minutes minutes face to face with the patient and more than 50% of that time was spent in counseling and/or coordination of care.

## 2012-02-17 NOTE — Telephone Encounter (Signed)
Patient called and wants to see Dr. Darnelle Catalan.  Confirmed 02/24/12 appt w/ pt as requested by Dr. Darnelle Catalan.  Emailed Cindy at [facility] to make her aware.

## 2012-02-17 NOTE — Progress Notes (Signed)
Please see the Nurse Progress Note in the MD Initial Consult Encounter for this patient. 

## 2012-02-17 NOTE — Progress Notes (Signed)
Menarche age 61, 45 2, menopause age 61, HRT x 12 yrs, quit 1 1/2 wks ago  Married, works part time

## 2012-02-18 NOTE — Progress Notes (Signed)
Encounter addended by: Glennie Hawk, RN on: 02/18/2012  9:40 AM<BR>     Documentation filed: Charges VN

## 2012-02-22 ENCOUNTER — Other Ambulatory Visit: Payer: BC Managed Care – PPO | Admitting: Lab

## 2012-02-22 ENCOUNTER — Ambulatory Visit: Payer: BC Managed Care – PPO

## 2012-02-22 ENCOUNTER — Ambulatory Visit: Payer: BC Managed Care – PPO | Admitting: Oncology

## 2012-02-23 ENCOUNTER — Other Ambulatory Visit: Payer: Self-pay | Admitting: Oncology

## 2012-02-23 DIAGNOSIS — C50919 Malignant neoplasm of unspecified site of unspecified female breast: Secondary | ICD-10-CM

## 2012-02-24 ENCOUNTER — Ambulatory Visit (HOSPITAL_BASED_OUTPATIENT_CLINIC_OR_DEPARTMENT_OTHER): Payer: BC Managed Care – PPO | Admitting: Oncology

## 2012-02-24 ENCOUNTER — Ambulatory Visit: Payer: BC Managed Care – PPO

## 2012-02-24 VITALS — BP 180/77 | HR 79 | Temp 98.7°F | Ht 63.0 in | Wt 135.8 lb

## 2012-02-24 DIAGNOSIS — D0591 Unspecified type of carcinoma in situ of right breast: Secondary | ICD-10-CM

## 2012-02-24 DIAGNOSIS — D0590 Unspecified type of carcinoma in situ of unspecified breast: Secondary | ICD-10-CM

## 2012-02-24 DIAGNOSIS — Z170 Estrogen receptor positive status [ER+]: Secondary | ICD-10-CM

## 2012-02-24 NOTE — Progress Notes (Signed)
ID: DELYNN OLVERA   DOB: 06/02/1951  MR#: [id number]  WUJ#:[id number]  HISTORY OF PRESENT ILLNESS: The patient had routine screening mammography January of this year, suggesting a possible abnormality in the right breast. She was referred to the breast Center for diagnostic mammography, which was performed 11/02/2011. There was a cluster of microcalcifications in the upper outer quadrant of the right breast which warranted biopsy. That was performed 11/09/2011 and showed ([id number]) atypical ductal hyperplasia with microcalcifications. The patient was referred to Dr. Claud Kelp for excision of this area, performed 12/13/2011. The pathology ([id number]) showed ductal carcinoma in situ, low-grade, measuring 2 mm, in the setting of a radial scar. Margins were negative and ample at 6 mm minimally. The tumor cells were estrogen receptor positive at 99% and progesterone receptor positive at 100%. Bilateral breast MRI 12/22/2011 showed only postsurgical changes. There was no MRI evidence of residual or additional malignancy in either breast.  INTERVAL HISTORY: The patient is seen today with her husband Virl Diamond to discuss the possibility of adjuvant hormonal therapy.  REVIEW OF SYSTEMS: The patient was having no particular symptoms leading to the mammogram, which was routine. She denies unusual headaches, visual changes, cough, phlegm production, pleurisy, shortness of breath, chest pain or pressure, palpitations, or change in bowel or bladder habits. A detailed review of systems today was entirely stable.  PAST MEDICAL HISTORY: Past Medical History  Diagnosis Date  . Arthritis     osteo  . Cough   . Sore throat     slight per patient history form dated 11/16/2011  . IBS (irritable bowel syndrome)     pt states "not as bad as used to be"  . Depression   . 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 Osteoarthritis     PAST SURGICAL HISTORY: Past Surgical History  Procedure Date  . Facelift  12/2004  . Colonoscopy   . Dilation and curettage of uterus   . Breast surgery 12/13/2011    Right Breast Lumpectomy    FAMILY HISTORY Family History  Problem Relation Age of Onset  . Cancer Father   . Cancer Paternal Aunt     breast  . Diabetes Maternal Grandmother   . Diabetes Cousin    the patient's father was diagnosed with widely metastatic cancer of unclear primary site and died at the age of 61. The patient's mother is alive, currently 21 years old and the patient has one brother and one sister. There were no other cancers in the immediate family and in no breast or ovarian cancer except in one of the patient's father's 5 sisters, who was diagnosed in her 5s.  GYNECOLOGIC HISTORY: Menarche age 55, menopause age 81, the patient is GX P42, with first live birth at age 81(this of course is associated with a doubling of breast cancer risk). The patient took hormones until April of 2013. She is making a good transition so far to being off replacement.  SOCIAL HISTORY: The patient works as a Interior and spatial designer. Her husband Virl Diamond is an Art gallery manager. Their only child, a daughter, died 6 years ago in an automobile accident. The patient has no grandchildren. She is not a Advice worker. At home they have two Yorkies and frequent out of town guests  ADVANCED DIRECTIVES:  HEALTH MAINTENANCE: History  Substance Use Topics  . Smoking status: Current Everyday Smoker -- 0.5 packs/day for 20 years    Types: Cigarettes  . Smokeless tobacco: Never Used   Comment:  quit then restarted  . Alcohol Use: Yes     2 glasses per day     Colonoscopy: 2008/[facility]  PAP: UTD/Ross  Bone density:remote/normal  Lipid panel:"good"/Jobe  No Known Allergies  Current Outpatient Prescriptions  Medication Sig Dispense Refill  . escitalopram (LEXAPRO) 10 MG tablet Take 10 mg by mouth daily.      . hyoscyamine (LEVSIN, ANASPAZ) 0.125 MG tablet Take 0.125 mg by mouth daily.        OBJECTIVE: Middle-aged white woman  who appears well There were no vitals filed for this visit.   There is no height or weight on file to calculate BMI.    ECOG FS: 0  Sclerae unicteric Oropharynx clear No cervical or supraclavicular adenopathy Lungs no rales or rhonchi Heart regular rate and rhythm Abd benign MSK no focal spinal tenderness, no peripheral edema Neuro: nonfocal Breasts: The right breast is status post recent lumpectomy; the incision is healing very nicely, with excellent cosmetic result; there are no skin changes, swelling, erythema, dehiscence, or nipple change. The left breast shows no suspicious findings  LAB RESULTS: Lab Results  Component Value Date   HGB 14.3 12/13/2011      Chemistry   No results found for this basename: NA,  K,  CL,  CO2,  BUN,  CREATININE,  GLU   No results found for this basename: CALCIUM,  ALKPHOS,  AST,  ALT,  BILITOT       No results found for this basename: LABCA2    No components found with this basename: LABCA125    No results found for this basename: INR:1;PROTIME:1 in the last 168 hours  Urinalysis No results found for this basename: colorurine,  appearanceur,  labspec,  phurine,  glucoseu,  hgbur,  bilirubinur,  ketonesur,  proteinur,  urobilinogen,  nitrite,  leukocytesur    STUDIES: BILATERAL BREAST MRI WITH AND WITHOUT CONTRAST  Technique: Multiplanar, multisequence MR images of both breasts  were obtained prior to and following the intravenous administration  of 13ml of Multihance. Three dimensional images were evaluated at  the independent DynaCad workstation.  Comparison: No prior MRI. Diagnostic mammogram dated 11/02/2011.  Findings: Innumerable foci of nonspecific enhancement are seen  bilaterally and there is moderate a background parenchymal  enhancement. Postsurgical changes of lumpectomy are noted in the  upper-outer quadrant of the right breast, middle third. No  suspicious mass or enhancement seen in either breast. There is no  internal  mammary or axillary adenopathy identified.  IMPRESSION:  Postsurgical changes of right lumpectomy. No MRI specific evidence  of residual or additional malignancy in either breast. The patient  will be due for a diagnostic mammogram in February 2014. Biennial  MRI may also be considered.  THREE-DIMENSIONAL MR IMAGE RENDERING ON INDEPENDENT WORKSTATION:  Three-dimensional MR images were rendered by post-processing of the  original MR data on an independent workstation. The three-  dimensional MR images were interpreted, and findings were reported  in the accompanying complete MRI report for this study.  BI-RADS CATEGORY 2: Benign finding(s).  Original Report Authenticated By: Hiram Gash, M.D.   ASSESSMENT: 61 year old [place] woman status post excisional biopsy of ductal carcinoma in situ measuring 2 mm, in the setting of a radial scar, with negative and ample margins, grade 1, estrogen and progesterone receptor strongly positive.   PLAN: the patient has a ready met with Dr. Dayton Scrape and using the Judith Part Score database he quoted her a risk of local recurrence with no adjuvant radiation of  in the 5% range. He did not feel a radiation was warranted for the small benefit it might bring. We had a similar discussion today regarding anti-estrogens: This is not a life-threatening cancer, and the risk of local recurrence would only be minimally impacted by taking 5 years of tamoxifen, Evista, or exemestane. Basically as far as his cancer is concerned she is "done with it".  On the other hand because she did have this cancer she is at risk of developing another. That risk may be as high as 1% per year. If she took anti-estrogens for 5 years she could cut that risk in half. We then discussed the possible toxicities side effects and complications of drugs like tamoxifen and raloxifene on one side and exemestane on the other. After this discussion she felt very comfortable not taking antiestrogen for  breast cancer prevention.  We then discussed postmenopausal issues. She's not having any vaginal dryness problems, but if she did I would feel comfortable with your using Vagifem suppositories or Estring in addition to not estrogen-containing lubricants like Replens. She is a ready on Lexapro, which may be helpful  with hot flashes, and his nighttime hot flashes become a problem she could be started in gabapentin 300 mg by mouth each bedtime.  Her prognosis is so good that I feel comfortable releasing her to her primary care physician's. She is comfortable with this as well. Accordingly we have not made a followup appointment for her here. Of course I will be glad to see her at any point in the future if the need arises. As far as breast cancer followup is concerned all she needs is yearly mammography and yearly physician breast exam.    Aika Brzoska C    02/24/2012

## 2012-03-08 ENCOUNTER — Encounter: Payer: Self-pay | Admitting: *Deleted

## 2012-03-08 NOTE — Progress Notes (Signed)
Mailed after appt letter to pt. 

## 2012-04-05 ENCOUNTER — Ambulatory Visit (INDEPENDENT_AMBULATORY_CARE_PROVIDER_SITE_OTHER): Payer: BC Managed Care – PPO | Admitting: General Surgery

## 2012-04-05 ENCOUNTER — Encounter (INDEPENDENT_AMBULATORY_CARE_PROVIDER_SITE_OTHER): Payer: Self-pay | Admitting: General Surgery

## 2012-04-05 VITALS — BP 125/79 | HR 90 | Temp 97.9°F | Ht 63.0 in | Wt 138.6 lb

## 2012-04-05 DIAGNOSIS — C50919 Malignant neoplasm of unspecified site of unspecified female breast: Secondary | ICD-10-CM

## 2012-04-05 NOTE — Patient Instructions (Signed)
Your right breast incision has healed very well. There are no apparent surgical problems.  You will be followed from here on out, but Dr. Darnelle Catalan and Dr. [place] Scrape do not feel any further treatment was necessary due to the low risk of local recurrence.  Return to see Dr. Derrell Lolling in February of 2014 after she gets her annual mammograms.

## 2012-04-05 NOTE — Progress Notes (Signed)
Subjective:     Patient ID: Patricia Hale, female   DOB: 03-02-1951, 61 y.o.   MRN: [id number]  HPI This patient returns for long-term followup regarding her right breast cancer, upper outer quadrant, ductal carcinoma in situ.  On December 13, 2011 she underwent right partial mastectomy because of image guided biopsy which showed atypical ductal hyperplasia. Final pathology showed a 2 mm focus of duct carcinoma in situ, receptor positive. MRI confirmed this was a solitary finding.  She has discontinued her estrogen supplementation. She has sen Dr. Chipper Herb who feels that there will be almost no benefit to radiation therapy and so that has been decided against. She has discussed antiestrogen therapy with Dr. Darnelle Catalan and they have decided that there is not enough benefit to warrant 5 years antiestrogen therapy and side effects.  Therefore, she will be followed with surveillance only. She has been discharged from Dr. Darrall Dears care.  She has no complaints about her right breast.  Review of Systems 10 system review of systems is performed and is noncontributory except as described above.    Objective:   Physical Exam Patient looks well. She is in no distress.  Skin warm and dry.  Alert and oriented. Mental status normal.               Breast:   the right breast is examined. Incision in the upper outer quadrant is well healed. Breast contour is excellent. Breast volume shows minimal reduction, less than 5% compared to left. Nipple projection is good, almost complete symmetrical.    Assessment:     2 mm focus ductal carcinoma in situ right breast, upper outer quadrant uneventful recovery following right partial mastectomy.  Multidisciplinary assessment concluded that there is no benefit to radiation therapy or antiestrogen therapy. The patient is pleased with this.    Plan:     I told her that in NCCN  guidelines are physical exam every 6 months for 5 years, yearly thereafter, and  annual mammography.  She'll return to see me in February 2014 after she gets her annual mammograms.   [name]. Derrell Lolling, M.D., [facility] General and Minimally invasive Surgery Breast and Colorectal Surgery Office:   [phone] Pager:   [phone]

## 2012-11-01 ENCOUNTER — Encounter (INDEPENDENT_AMBULATORY_CARE_PROVIDER_SITE_OTHER): Payer: Self-pay | Admitting: General Surgery

## 2012-11-03 ENCOUNTER — Other Ambulatory Visit: Payer: Self-pay | Admitting: Obstetrics and Gynecology

## 2012-12-21 ENCOUNTER — Ambulatory Visit (INDEPENDENT_AMBULATORY_CARE_PROVIDER_SITE_OTHER): Payer: BC Managed Care – PPO | Admitting: General Surgery

## 2012-12-22 ENCOUNTER — Telehealth (INDEPENDENT_AMBULATORY_CARE_PROVIDER_SITE_OTHER): Payer: Self-pay

## 2012-12-22 NOTE — Telephone Encounter (Signed)
The pt called me back.  She says she had her mgm at Dr Tenny Craw' office (her Gyn).  I will call and get a report faxed.Marland KitchenMarland KitchenMarland KitchenI called and spoke to Kennyth Arnold [phone] at [facility] and she will fax the report.

## 2012-12-22 NOTE — Telephone Encounter (Signed)
I called and left the pt a message to call.  She has an appointment for 4/1 and hasn't had her mgm.  She is overdue for that and should have it before she comes in.  I want to let her know.

## 2012-12-26 ENCOUNTER — Encounter (INDEPENDENT_AMBULATORY_CARE_PROVIDER_SITE_OTHER): Payer: Self-pay | Admitting: General Surgery

## 2012-12-26 ENCOUNTER — Ambulatory Visit (INDEPENDENT_AMBULATORY_CARE_PROVIDER_SITE_OTHER): Payer: BC Managed Care – PPO | Admitting: General Surgery

## 2012-12-26 VITALS — BP 124/82 | HR 80 | Temp 97.6°F | Resp 14 | Ht 63.5 in | Wt 143.8 lb

## 2012-12-26 DIAGNOSIS — D0590 Unspecified type of carcinoma in situ of unspecified breast: Secondary | ICD-10-CM

## 2012-12-26 DIAGNOSIS — D0591 Unspecified type of carcinoma in situ of right breast: Secondary | ICD-10-CM

## 2012-12-26 NOTE — Progress Notes (Signed)
Patient ID: Patricia Hale, female   DOB: 06/25/1951, 62 y.o.   MRN: [id number] History: This patient returns for long-term followup regarding her right breast cancer, upper outer quadrant, ductal carcinoma in situ, 2 mm focus. On 12/13/2011 she underwent right partial mastectomy because of atypical ductal hyperplasia, but final pathology showed a 2 mm area of  DCIS, receptor positive. MRI confirmed this was a solitary finding. She discontinued estrogen supplementation. She saw Chipper Herb and Dr. Darnelle Catalan, who told her said she would not benefit from radiation therapy or antiestrogen therapy it was decided to be followed with surveillance only. She is now following with me and with Dr. Duane Lope. She has no complaints about her breast. She is very pleased with the cosmetic result. Bilateral mammograms on 11/07/2012 read by Dr. Anselmo Pickler  looked good, category 2, no focal abnormality. Her health is otherwise good.  Exam: Patient looks well. No distress Neck reveals no adenopathy or mass. Lungs are clear to auscultation bilaterally Heart regular rate and rhythm. No murmur. No ectopy Breasts: Well-healed scar upper outer quadrant right breast. No mass in either breast. No other skin changes. No axillary adenopathy  Assessment: 2 mm DCIS, receptor positive, upper outer quadrant right breast. No evidence of recurrence one year following right partial mastectomy without other therapies  Plan: She will get a breast exam annually with Dr. Alfonse Flavors. Repeat mammograms in February Return to see me in one year after mammograms. I will follow her annually for surveillance.   [name]. Derrell Lolling, M.D., [facility] General and Minimally invasive Surgery Breast and Colorectal Surgery Office:   [phone] Pager:   [phone]

## 2012-12-26 NOTE — Patient Instructions (Signed)
Your breast exam and lymph node exam today is completely normal.  Your recent mammograms are also normal, no focal abnormality.  There is no evidence of cancer.  I recommend that you see Dr. Tenny Craw once a year for a breast exam.  Repeat bilateral mammogram in 1 year, and then see Dr. Derrell Lolling after the mammograms are done.

## 2013-01-04 ENCOUNTER — Encounter (INDEPENDENT_AMBULATORY_CARE_PROVIDER_SITE_OTHER): Payer: Self-pay

## 2014-01-22 ENCOUNTER — Encounter (INDEPENDENT_AMBULATORY_CARE_PROVIDER_SITE_OTHER): Payer: Self-pay | Admitting: General Surgery

## 2014-01-22 ENCOUNTER — Ambulatory Visit (INDEPENDENT_AMBULATORY_CARE_PROVIDER_SITE_OTHER): Payer: BC Managed Care – PPO | Admitting: General Surgery

## 2014-01-22 VITALS — BP 123/80 | HR 84 | Temp 98.2°F | Resp 16 | Ht 63.0 in | Wt 146.2 lb

## 2014-01-22 DIAGNOSIS — C50919 Malignant neoplasm of unspecified site of unspecified female breast: Secondary | ICD-10-CM

## 2014-01-22 NOTE — Progress Notes (Signed)
Patient ID: Patricia Hale, female   DOB: Dec 24, 1950, 63 y.o.   MRN: [id number]   History: This patient returns for long-term followup regarding her right breast cancer, upper outer quadrant, ductal carcinoma in situ, 2 mm focus.  On 12/13/2011 she underwent right partial mastectomy because of atypical ductal hyperplasia, but final pathology showed a 2 mm area of DCIS, receptor positive. MRI confirmed this was a solitary finding.  She discontinued estrogen supplementation. She saw Arloa Koh and Dr. Jana Hakim, who told her said she would not benefit from radiation therapy or antiestrogen therapy it was decided to be followed with surveillance only.  She is now following with me and with Dr. Melinda Crutch.  She has no complaints about her breast. She is very pleased with the cosmetic result. Bilateral mammograms recently at Dr. Alan Ripper office  .Marland KitchenShe states that she was told these are normal.                          Her health is otherwise good.   Past history, family history, social history, and review of systems are documented on the chart, unchanged, noncontributory except as described above.  Exam: Patient looks well. No distress  Neck reveals no adenopathy or mass.  Lungs are clear to auscultation bilaterally  Heart regular rate and rhythm. No murmur. No ectopy  Breasts: Well-healed scar upper outer quadrant right breast. No mass in either breast. No other skin changes. No axillary adenopathy   Assessment: 2 mm DCIS, receptor positive, upper outer quadrant right breast. No evidence of recurrence one year following right partial mastectomy without other therapies   Plan: She will get a breast exam annually with Dr. Melinda Crutch.  Repeat mammograms Annually. We talked about management of her breast cancer risk. She stated that she will get a breast exam annually with Dr. Harrington Challenger and he will review her mammograms annually. Return to see me if new problems Arise. That was her preference     Edsel Petrin. Dalbert Batman, M.D., [facility]  General and Minimally invasive Surgery  Breast and Colorectal Surgery  Office: [phone]

## 2014-01-22 NOTE — Patient Instructions (Signed)
Examination of her breasts and all the regional lymph nodes today is normal. There is no evidence of cancer by physical exam.  You state that your mammograms in February were normal. That is also good news. I will get a copy of that report from Dr. Alan Ripper office.  We have talked about management of your breast cancer risk and long-term surveillance. You state that your preference is for Dr. Harrington Challenger to  perform annual breast exam and review your mammograms. That is very reasonable, as long as it is done once a year  Return to see Dr. Dalbert Batman as needed.

## 2014-02-11 ENCOUNTER — Encounter (INDEPENDENT_AMBULATORY_CARE_PROVIDER_SITE_OTHER): Payer: Self-pay

## 2014-07-29 ENCOUNTER — Encounter (INDEPENDENT_AMBULATORY_CARE_PROVIDER_SITE_OTHER): Payer: Self-pay | Admitting: General Surgery

## 2015-12-19 ENCOUNTER — Other Ambulatory Visit: Payer: Self-pay | Admitting: Obstetrics & Gynecology

## 2015-12-19 DIAGNOSIS — Z853 Personal history of malignant neoplasm of breast: Secondary | ICD-10-CM

## 2015-12-26 ENCOUNTER — Ambulatory Visit
Admission: RE | Admit: 2015-12-26 | Discharge: 2015-12-26 | Disposition: A | Discharge status: Home / Self Care | Payer: Medicare Other | Source: Ambulatory Visit | Attending: Obstetrics & Gynecology | Admitting: Obstetrics & Gynecology

## 2015-12-26 DIAGNOSIS — Z853 Personal history of malignant neoplasm of breast: Secondary | ICD-10-CM

## 2016-09-21 ENCOUNTER — Emergency Department (HOSPITAL_BASED_OUTPATIENT_CLINIC_OR_DEPARTMENT_OTHER)
Admission: EM | Admit: 2016-09-21 | Discharge: 2016-09-21 | Disposition: A | Discharge status: Home / Self Care | Payer: Medicare Other | Attending: Emergency Medicine | Admitting: Emergency Medicine

## 2016-09-21 ENCOUNTER — Emergency Department (HOSPITAL_BASED_OUTPATIENT_CLINIC_OR_DEPARTMENT_OTHER): Payer: Medicare Other

## 2016-09-21 ENCOUNTER — Encounter (HOSPITAL_BASED_OUTPATIENT_CLINIC_OR_DEPARTMENT_OTHER): Payer: Self-pay

## 2016-09-21 DIAGNOSIS — R0981 Nasal congestion: Secondary | ICD-10-CM | POA: Diagnosis present

## 2016-09-21 DIAGNOSIS — Z853 Personal history of malignant neoplasm of breast: Secondary | ICD-10-CM | POA: Insufficient documentation

## 2016-09-21 DIAGNOSIS — F17210 Nicotine dependence, cigarettes, uncomplicated: Secondary | ICD-10-CM | POA: Diagnosis not present

## 2016-09-21 DIAGNOSIS — J0100 Acute maxillary sinusitis, unspecified: Secondary | ICD-10-CM | POA: Diagnosis not present

## 2016-09-21 HISTORY — DX: Gastro-esophageal reflux disease without esophagitis: K21.9

## 2016-09-21 MED ORDER — AMOXICILLIN 500 MG PO CAPS: 1000.0000 mg | ORAL_CAPSULE | Freq: Two times a day (BID) | ORAL | 0 refills | Status: AC

## 2016-09-21 MED ORDER — HYDROCOD POLST-CPM POLST ER 10-8 MG/5ML PO SUER: 5.0000 mL | Freq: Two times a day (BID) | ORAL | 0 refills | Status: AC

## 2016-09-21 MED FILL — AMOXICILLIN 500 MG CAPSULE: 500 | 10 days supply | Qty: 42 | Fill #0

## 2016-09-21 MED FILL — HYDROCODONE-CHLORPHENIRAM S: 10-8 | 12 days supply | Qty: 115 | Fill #0

## 2016-09-21 NOTE — Discharge Instructions (Signed)
Return to the ED with any concerns including difficulty breathing, vomiting and not able to keep down liquds or antibiotics, decreased level of alertness/lethargy, or any other alarming symptoms

## 2016-09-21 NOTE — ED Provider Notes (Signed)
[place] DEPT [facility] Provider Note   CSN: [id number] Arrival date & time: 09/21/16  0957     History   Chief Complaint Chief Complaint  Patient presents with  . Nasal Congestion    HPI Patricia Hale is a 65 y.o. female.  HPI  Pt presenting with c/o cough, nasal congestion over the past 2 weeks.  No fever.  Illness started with sore throat, congestion and cough have lasted longer.  She describes feeling sinus pressure in her head and face.  No difficulty breathing.  No vomiting or diarrhea.  She has come to the ED because she feels she needs an antibiotic.  She did have flu and pneumonia shot this season.  There are no other associated systemic symptoms, there are no other alleviating or modifying factors.   Past Medical History:  Diagnosis Date  . Arthritis    osteo  . Breast cancer ([place]) 12/13/11   DCIS,  ER/PR +  . Cough   . Depression   . GERD (gastroesophageal reflux disease)   . IBS (irritable bowel syndrome)    pt states "not as bad as used to be"  . Osteoarthritis   . Sore throat    slight per patient history form dated 11/16/2011    Patient Active Problem List   Diagnosis Date Noted  . 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 Breast cancer ([place]) 12/13/2011    Past Surgical History:  Procedure Laterality Date  . BREAST SURGERY  12/13/2011   Right Breast Lumpectomy  . COLONOSCOPY    . DILATION AND CURETTAGE OF UTERUS    . FACELIFT  12/2004    OB History    Obstetric Comments   HRT       Home Medications    Prior to Admission medications   Medication Sig Start Date End Date Taking? Authorizing Provider  atorvastatin (LIPITOR) 10 MG tablet Take 10 mg by mouth daily.   Yes Historical Provider, MD  escitalopram (LEXAPRO) 10 MG tablet Take 10 mg by mouth daily.   Yes Historical Provider, MD  omeprazole (PRILOSEC) 40 MG capsule  01/07/14  Yes Historical Provider, MD  amoxicillin (AMOXIL) 500 MG capsule Take 2 capsules (1,000 mg total) by mouth 2  (two) times daily. 09/21/16   Alfonzo Beers, MD  chlorpheniramine-HYDROcodone (TUSSIONEX PENNKINETIC ER) 10-8 MG/5ML SUER Take 5 mLs by mouth 2 (two) times daily. 09/21/16   Alfonzo Beers, MD    Family History Family History  Problem Relation Age of Onset  . Cancer Father     unknown  . Cancer Paternal Aunt     breast  . Diabetes Cousin   . Diabetes Maternal Grandmother     Social History Social History  Substance Use Topics  . Smoking status: Current Every Day Smoker    Packs/day: 0.50    Years: 20.00    Types: Cigarettes  . Smokeless tobacco: Never Used     Comment: quit then restarted  . Alcohol use Yes     Comment: 2 glasses per day     Allergies   Patient has no known allergies.   Review of Systems Review of Systems  ROS reviewed and all otherwise negative except for mentioned in HPI   Physical Exam Updated Vital Signs BP 140/72 (BP Location: Right Arm)   Pulse 79   Temp 98.1 F (36.7 C) (Oral)   Resp 16   Ht 5\' 3"  (1.6 m)   Wt 150 lb (68 kg)   SpO2  97%   BMI 26.57 kg/m  Vitals reviewed Physical Exam Physical Examination: General appearance - alert, well appearing, and in no distress Mental status - alert, oriented to person, place, and time Eyes - no conjunctival injection, no scleral icterus Face- no ttp tapping ovr maxillary and frontal sinuses Mouth - mucous membranes moist, pharynx normal without lesions Neck - supple, no significant adenopathy Chest - clear to auscultation, no wheezes, rales or rhonchi, symmetric air entry Heart - normal rate, regular rhythm, normal S1, S2, no murmurs, rubs, clicks or gallops Abdomen - soft, nontender, nondistended, no masses or organomegaly Neurological - alert, oriented, normal speech Extremities - peripheral pulses normal, no pedal edema, no clubbing or cyanosis Skin - normal coloration and turgor, no rashes  ED Treatments / Results  Labs (all labs ordered are listed, but only abnormal results are  displayed) Labs Reviewed - No data to display  EKG  EKG Interpretation None       Radiology Dg Chest 2 View  Result Date: 09/21/2016 CLINICAL DATA:  Cough. EXAM: CHEST  2 VIEW COMPARISON:  01/12/2016. FINDINGS: Mediastinum and hilar structures normal. Lungs are clear. Heart size pulmonary vascularity appear normal. No pleural effusion or pneumothorax. Pectus deformity.Diffuse degenerative change thoracic spine . Scratched No acute bony abnormality. IMPRESSION: No acute cardiopulmonary disease. Electronically Signed   By: Marcello Moores  Register   On: 09/21/2016 10:46    Procedures Procedures (including critical care time)  Medications Ordered in ED Medications - No data to display   Initial Impression / Assessment and Plan / ED Course  I have reviewed the triage vital signs and the nursing notes.  Pertinent labs & imaging results that were available during my care of the patient were reviewed by me and considered in my medical decision making (see chart for details).  Clinical Course     Pt presenting with congestion, sinus pressure and pain- symptoms have been ongoing for 2 weeks.  CXR reassuring- will treat for sinusitis- started on amoxicillin for long course.  Discharged with strict return precautions.  Pt agreeable with plan.  Final Clinical Impressions(s) / ED Diagnoses   Final diagnoses:  Acute maxillary sinusitis, recurrence not specified    New Prescriptions Discharge Medication List as of 09/21/2016 11:21 AM    START taking these medications   Details  amoxicillin (AMOXIL) 500 MG capsule Take 2 capsules (1,000 mg total) by mouth 2 (two) times daily., Starting Tue 09/21/2016, Print         Alfonzo Beers, MD 09/22/16 [phone]

## 2016-09-21 NOTE — ED Triage Notes (Addendum)
Patient reports 2 week history of nasal congestion, cough - productive with yellow sputum, frontal and maxillary sinus pressure, runny nose. Denies fever, chills, or body aches. Denies sick contacts. Reports no relief from Delsym, states that Advil Sinus relieved the sinus pressure.

## 2016-10-18 ENCOUNTER — Other Ambulatory Visit: Payer: Self-pay | Admitting: Obstetrics & Gynecology

## 2016-10-18 DIAGNOSIS — Z853 Personal history of malignant neoplasm of breast: Secondary | ICD-10-CM

## 2016-11-20 ENCOUNTER — Encounter (HOSPITAL_BASED_OUTPATIENT_CLINIC_OR_DEPARTMENT_OTHER): Payer: Self-pay | Admitting: Emergency Medicine

## 2016-11-20 ENCOUNTER — Emergency Department (HOSPITAL_BASED_OUTPATIENT_CLINIC_OR_DEPARTMENT_OTHER)
Admission: EM | Admit: 2016-11-20 | Discharge: 2016-11-20 | Disposition: A | Discharge status: Home / Self Care | Payer: Medicare Other | Attending: Emergency Medicine | Admitting: Emergency Medicine

## 2016-11-20 DIAGNOSIS — Y9389 Activity, other specified: Secondary | ICD-10-CM | POA: Diagnosis not present

## 2016-11-20 DIAGNOSIS — F17210 Nicotine dependence, cigarettes, uncomplicated: Secondary | ICD-10-CM | POA: Diagnosis not present

## 2016-11-20 DIAGNOSIS — Y929 Unspecified place or not applicable: Secondary | ICD-10-CM | POA: Insufficient documentation

## 2016-11-20 DIAGNOSIS — S61213A Laceration without foreign body of left middle finger without damage to nail, initial encounter: Secondary | ICD-10-CM | POA: Diagnosis not present

## 2016-11-20 DIAGNOSIS — W268XXA Contact with other sharp object(s), not elsewhere classified, initial encounter: Secondary | ICD-10-CM | POA: Insufficient documentation

## 2016-11-20 DIAGNOSIS — Z23 Encounter for immunization: Secondary | ICD-10-CM | POA: Insufficient documentation

## 2016-11-20 DIAGNOSIS — S61211A Laceration without foreign body of left index finger without damage to nail, initial encounter: Secondary | ICD-10-CM | POA: Insufficient documentation

## 2016-11-20 DIAGNOSIS — Y998 Other external cause status: Secondary | ICD-10-CM | POA: Insufficient documentation

## 2016-11-20 DIAGNOSIS — Z853 Personal history of malignant neoplasm of breast: Secondary | ICD-10-CM | POA: Insufficient documentation

## 2016-11-20 DIAGNOSIS — S61219A Laceration without foreign body of unspecified finger without damage to nail, initial encounter: Secondary | ICD-10-CM

## 2016-11-20 MED ORDER — TETANUS-DIPHTH-ACELL PERTUSSIS 5-2.5-18.5 LF-MCG/0.5 IM SUSP
0.5000 mL | Freq: Once | INTRAMUSCULAR | Status: AC
  Administered 2016-11-20: 0.5 mL via INTRAMUSCULAR
  Filled 2016-11-20: qty 0.5

## 2016-11-20 MED ORDER — CEPHALEXIN 500 MG PO CAPS: 500.0000 mg | ORAL_CAPSULE | Freq: Four times a day (QID) | ORAL | 0 refills | Status: AC

## 2016-11-20 MED ORDER — BACITRACIN ZINC 500 UNIT/GM EX OINT: TOPICAL_OINTMENT | Freq: Two times a day (BID) | CUTANEOUS | Status: DC

## 2016-11-20 NOTE — ED Triage Notes (Addendum)
Lac to 4th and 5th finger left hand, cut on electric saw, oozing blood from lac to 5th finger, dressing applied, CMS intact, total of 3 cuts to fingers

## 2016-11-20 NOTE — ED Provider Notes (Addendum)
Addendum: splint placed on 2nd and 3rd digit of the left hand over wound.     [facility] Provider Note   CSN: [id number] Arrival date & time: 11/20/16  1606   By signing my name below, I, Neta Mends, attest that this documentation has been prepared under the direction and in the presence of Melina Schools, PA-C. Electronically Signed: Neta Mends, ED Scribe. 11/20/2016. 5:54 PM.   History   Chief Complaint Chief Complaint  Patient presents with  . Laceration    The history is provided by the patient. No language interpreter was used.   HPI Comments:  Patricia Hale is a 66 y.o. female who presents to the Emergency Department complaining of a wound sustained to the left 2th and 3rd digits that occurred 2 hours ago. Pt states that she was using a bush trimmer and accidentally cut her left 4th and 5th fingers. She states that she is not having any pain at this time. Bleeding is controlled with pressure dressing. Tetanus is not utd. Pt denies numbness, weakness, decreased rom. Denies any blood thinners.   Past Medical History:  Diagnosis Date  . Arthritis    osteo  . 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 Cough   . Depression   . GERD (gastroesophageal reflux disease)   . IBS (irritable bowel syndrome)    pt states "not as bad as used to be"  . Osteoarthritis   . Sore throat    slight per patient history form dated 11/16/2011    Patient Active Problem List   Diagnosis Date Noted  . 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 Breast cancer ([place]) 12/13/2011    Past Surgical History:  Procedure Laterality Date  . BREAST SURGERY  12/13/2011   Right Breast Lumpectomy  . COLONOSCOPY    . DILATION AND CURETTAGE OF UTERUS    . FACELIFT  12/2004    OB History    Obstetric Comments   HRT       Home Medications    Prior to Admission medications   Medication Sig Start Date End Date Taking? Authorizing Provider  atorvastatin  (LIPITOR) 10 MG tablet Take 10 mg by mouth daily.   Yes Historical Provider, MD  escitalopram (LEXAPRO) 10 MG tablet Take 15 mg by mouth daily.    Yes Historical Provider, MD  omeprazole (PRILOSEC) 40 MG capsule  01/07/14  Yes Historical Provider, MD  amoxicillin (AMOXIL) 500 MG capsule Take 2 capsules (1,000 mg total) by mouth 2 (two) times daily. 09/21/16   Alfonzo Beers, MD  chlorpheniramine-HYDROcodone (TUSSIONEX PENNKINETIC ER) 10-8 MG/5ML SUER Take 5 mLs by mouth 2 (two) times daily. 09/21/16   Alfonzo Beers, MD    Family History Family History  Problem Relation Age of Onset  . Cancer Father     unknown  . Cancer Paternal Aunt     breast  . Diabetes Cousin   . Diabetes Maternal Grandmother     Social History Social History  Substance Use Topics  . Smoking status: Current Every Day Smoker    Packs/day: 0.50    Years: 20.00    Types: Cigarettes  . Smokeless tobacco: Never Used     Comment: quit then restarted  . Alcohol use Yes     Comment: 2 glasses per day     Allergies   Patient has no known allergies.   Review of Systems Review of Systems  Musculoskeletal: Negative for  arthralgias.  Skin: Positive for wound.  Neurological: Negative for weakness and numbness.  All other systems reviewed and are negative.    Physical Exam Updated Vital Signs BP 139/87 (BP Location: Left Arm)   Pulse 102   Temp 98.4 F (36.9 C) (Oral)   Resp 18   Ht 5\' 4"  (1.626 m)   Wt 145 lb (65.8 kg)   SpO2 100%   BMI 24.89 kg/m   Physical Exam  Constitutional: She appears well-developed and well-nourished. No distress.  HENT:  Head: Normocephalic and atraumatic.  Eyes: Conjunctivae are normal.  Cardiovascular: Normal rate.   Pulmonary/Chest: Effort normal.  Abdominal: She exhibits no distension.  Musculoskeletal:  3 small avulsion abrasions are noted to the second and third digits of the left hand. Wounds are superficial and do not extend deep. No tendon exposure is noted.  Patient has full flexion and extension of all joints in the left hand. Bleeding is controlled. Cap refill is normal.  Neurological: She is alert.  Sensation intact to sharp and dull, bleeding controlled, radial pulses 2+ bilaterally, full flexion extension of DIP and PIP.   Skin: Skin is warm and dry. Capillary refill takes less than 2 seconds.  Psychiatric: She has a normal mood and affect.  Nursing note and vitals reviewed.      ED Treatments / Results  DIAGNOSTIC STUDIES:  Oxygen Saturation is 100% on RA, normal by my interpretation.    COORDINATION OF CARE:  5:54 PM Discussed treatment plan with pt at bedside and pt agreed to plan.   Labs (all labs ordered are listed, but only abnormal results are displayed) Labs Reviewed - No data to display  EKG  EKG Interpretation None       Radiology No results found.  Procedures Procedures (including critical care time)  Medications Ordered in ED Medications  Tdap (BOOSTRIX) injection 0.5 mL (0.5 mLs Intramuscular Given 11/20/16 1701)     Initial Impression / Assessment and Plan / ED Course  I have reviewed the triage vital signs and the nursing notes.  Pertinent labs & imaging results that were available during my care of the patient were reviewed by me and considered in my medical decision making (see chart for details).     Patient sustained avulsion abrasions to the left second and third digits prior to arrival. Tetanus was not up-to-date. T Given. Patient did not want any x-rays of the left hand. She has full range of motion including flexion and extensions of the DIP, PIP, MC. Neurovascularly intact. Wounds are superficial and filled I do not need closure. Discussed with patient Dermabond however feel that this would increase infection risk. Patient is agreeable to not having sutures placed. Bleeding has been controlled. Had Dr. Billy Fischer evaluate women's and she agrees that sutures aren't indicated. Recommends  antibiotic ointment and symptomatic wound care at home. Patient given static splints for comfort. Have placed patient on prophylactic antibiotics due to area of wound. 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 The patient is agreeable with the above plan. Wounds were extensively cleaned. Bandage and an hepatic ointment applied along with splint. Encouraged follow-up with PCP in 2-3 days for wound recheck. Return to the ED sooner if she develops any signs of infection. Pt is hemodynamically stable, in NAD, & able to ambulate in the ED. Pain has been managed & has no complaints prior to dc. Pt is comfortable with above plan and is stable for discharge at this time. All questions were  answered prior to disposition. Strict return precautions for f/u to the ED were discussed.   Final Clinical Impressions(s) / ED Diagnoses   Final diagnoses:  Laceration of finger of left hand without foreign body without damage to nail, unspecified finger, initial encounter    New Prescriptions Discharge Medication List as of 11/20/2016  8:03 PM    START taking these medications   Details  cephALEXin (KEFLEX) 500 MG capsule Take 1 capsule (500 mg total) by mouth 4 (four) times daily., Starting Sat 11/20/2016, Print      I personally performed the services described in this documentation, which was scribed in my presence. The recorded information has been reviewed and is accurate.     Doristine Devoid, PA-C 11/21/16 1554    Gareth Morgan, MD 11/23/16 1203    Doristine Devoid, PA-C 11/27/16 [id number]    Gareth Morgan, MD 12/01/16 1406

## 2016-11-20 NOTE — Discharge Instructions (Signed)
Please keep wound clean and dry. Please use antibiotic ointment. Would like for you have a wound recheck in 2-3 days to make sure it is improving. Return to the ED sooner if it appears red, drains purulent fluid, worensening pain. Have given you a short course of antibiotics to take for wound prophylaxis. Motrin and tyelnol for pain.

## 2016-12-31 ENCOUNTER — Ambulatory Visit
Admission: RE | Admit: 2016-12-31 | Discharge: 2016-12-31 | Disposition: A | Discharge status: Home / Self Care | Payer: Medicare Other | Source: Ambulatory Visit | Attending: Obstetrics & Gynecology | Admitting: Obstetrics & Gynecology

## 2016-12-31 DIAGNOSIS — Z853 Personal history of malignant neoplasm of breast: Secondary | ICD-10-CM

## 2017-11-09 DIAGNOSIS — M9900 Segmental and somatic dysfunction of head region: Secondary | ICD-10-CM | POA: Diagnosis not present

## 2017-11-09 DIAGNOSIS — M5412 Radiculopathy, cervical region: Secondary | ICD-10-CM | POA: Diagnosis not present

## 2017-11-09 DIAGNOSIS — M9901 Segmental and somatic dysfunction of cervical region: Secondary | ICD-10-CM | POA: Diagnosis not present

## 2017-11-15 DIAGNOSIS — M9901 Segmental and somatic dysfunction of cervical region: Secondary | ICD-10-CM | POA: Diagnosis not present

## 2017-11-15 DIAGNOSIS — M5412 Radiculopathy, cervical region: Secondary | ICD-10-CM | POA: Diagnosis not present

## 2017-11-15 DIAGNOSIS — M9900 Segmental and somatic dysfunction of head region: Secondary | ICD-10-CM | POA: Diagnosis not present

## 2017-11-16 DIAGNOSIS — M9900 Segmental and somatic dysfunction of head region: Secondary | ICD-10-CM | POA: Diagnosis not present

## 2017-11-16 DIAGNOSIS — M5412 Radiculopathy, cervical region: Secondary | ICD-10-CM | POA: Diagnosis not present

## 2017-11-16 DIAGNOSIS — M9901 Segmental and somatic dysfunction of cervical region: Secondary | ICD-10-CM | POA: Diagnosis not present

## 2017-11-23 DIAGNOSIS — M9901 Segmental and somatic dysfunction of cervical region: Secondary | ICD-10-CM | POA: Diagnosis not present

## 2017-11-23 DIAGNOSIS — M9900 Segmental and somatic dysfunction of head region: Secondary | ICD-10-CM | POA: Diagnosis not present

## 2017-11-23 DIAGNOSIS — M5412 Radiculopathy, cervical region: Secondary | ICD-10-CM | POA: Diagnosis not present

## 2017-11-24 DIAGNOSIS — M9900 Segmental and somatic dysfunction of head region: Secondary | ICD-10-CM | POA: Diagnosis not present

## 2017-11-24 DIAGNOSIS — M5412 Radiculopathy, cervical region: Secondary | ICD-10-CM | POA: Diagnosis not present

## 2017-11-24 DIAGNOSIS — M9901 Segmental and somatic dysfunction of cervical region: Secondary | ICD-10-CM | POA: Diagnosis not present

## 2017-11-29 DIAGNOSIS — M9900 Segmental and somatic dysfunction of head region: Secondary | ICD-10-CM | POA: Diagnosis not present

## 2017-11-29 DIAGNOSIS — M9901 Segmental and somatic dysfunction of cervical region: Secondary | ICD-10-CM | POA: Diagnosis not present

## 2017-11-29 DIAGNOSIS — M5412 Radiculopathy, cervical region: Secondary | ICD-10-CM | POA: Diagnosis not present

## 2017-12-01 DIAGNOSIS — M5412 Radiculopathy, cervical region: Secondary | ICD-10-CM | POA: Diagnosis not present

## 2017-12-01 DIAGNOSIS — M9900 Segmental and somatic dysfunction of head region: Secondary | ICD-10-CM | POA: Diagnosis not present

## 2017-12-01 DIAGNOSIS — M9901 Segmental and somatic dysfunction of cervical region: Secondary | ICD-10-CM | POA: Diagnosis not present

## 2017-12-06 DIAGNOSIS — M5412 Radiculopathy, cervical region: Secondary | ICD-10-CM | POA: Diagnosis not present

## 2017-12-06 DIAGNOSIS — M9901 Segmental and somatic dysfunction of cervical region: Secondary | ICD-10-CM | POA: Diagnosis not present

## 2017-12-06 DIAGNOSIS — M9900 Segmental and somatic dysfunction of head region: Secondary | ICD-10-CM | POA: Diagnosis not present

## 2017-12-07 DIAGNOSIS — M9901 Segmental and somatic dysfunction of cervical region: Secondary | ICD-10-CM | POA: Diagnosis not present

## 2017-12-07 DIAGNOSIS — M5412 Radiculopathy, cervical region: Secondary | ICD-10-CM | POA: Diagnosis not present

## 2017-12-07 DIAGNOSIS — M9900 Segmental and somatic dysfunction of head region: Secondary | ICD-10-CM | POA: Diagnosis not present

## 2017-12-13 DIAGNOSIS — M9901 Segmental and somatic dysfunction of cervical region: Secondary | ICD-10-CM | POA: Diagnosis not present

## 2017-12-13 DIAGNOSIS — M5412 Radiculopathy, cervical region: Secondary | ICD-10-CM | POA: Diagnosis not present

## 2017-12-13 DIAGNOSIS — M9900 Segmental and somatic dysfunction of head region: Secondary | ICD-10-CM | POA: Diagnosis not present

## 2017-12-14 DIAGNOSIS — M5412 Radiculopathy, cervical region: Secondary | ICD-10-CM | POA: Diagnosis not present

## 2017-12-14 DIAGNOSIS — M9900 Segmental and somatic dysfunction of head region: Secondary | ICD-10-CM | POA: Diagnosis not present

## 2017-12-14 DIAGNOSIS — M9901 Segmental and somatic dysfunction of cervical region: Secondary | ICD-10-CM | POA: Diagnosis not present

## 2017-12-20 DIAGNOSIS — M9901 Segmental and somatic dysfunction of cervical region: Secondary | ICD-10-CM | POA: Diagnosis not present

## 2017-12-20 DIAGNOSIS — M5412 Radiculopathy, cervical region: Secondary | ICD-10-CM | POA: Diagnosis not present

## 2017-12-20 DIAGNOSIS — M9900 Segmental and somatic dysfunction of head region: Secondary | ICD-10-CM | POA: Diagnosis not present

## 2017-12-26 DIAGNOSIS — Z124 Encounter for screening for malignant neoplasm of cervix: Secondary | ICD-10-CM | POA: Diagnosis not present

## 2017-12-26 DIAGNOSIS — Z6825 Body mass index (BMI) 25.0-25.9, adult: Secondary | ICD-10-CM | POA: Diagnosis not present

## 2017-12-27 ENCOUNTER — Other Ambulatory Visit: Payer: Self-pay | Admitting: Obstetrics & Gynecology

## 2017-12-27 DIAGNOSIS — Z1231 Encounter for screening mammogram for malignant neoplasm of breast: Secondary | ICD-10-CM

## 2018-01-05 DIAGNOSIS — M9900 Segmental and somatic dysfunction of head region: Secondary | ICD-10-CM | POA: Diagnosis not present

## 2018-01-05 DIAGNOSIS — M5412 Radiculopathy, cervical region: Secondary | ICD-10-CM | POA: Diagnosis not present

## 2018-01-05 DIAGNOSIS — M9901 Segmental and somatic dysfunction of cervical region: Secondary | ICD-10-CM | POA: Diagnosis not present

## 2018-01-06 DIAGNOSIS — Z1231 Encounter for screening mammogram for malignant neoplasm of breast: Secondary | ICD-10-CM | POA: Diagnosis not present

## 2018-01-20 ENCOUNTER — Ambulatory Visit: Payer: Medicare Other

## 2018-02-24 DIAGNOSIS — F330 Major depressive disorder, recurrent, mild: Secondary | ICD-10-CM | POA: Diagnosis not present

## 2018-02-24 DIAGNOSIS — R7303 Prediabetes: Secondary | ICD-10-CM | POA: Diagnosis not present

## 2018-02-24 DIAGNOSIS — E785 Hyperlipidemia, unspecified: Secondary | ICD-10-CM | POA: Diagnosis not present

## 2018-02-24 DIAGNOSIS — K219 Gastro-esophageal reflux disease without esophagitis: Secondary | ICD-10-CM | POA: Diagnosis not present

## 2018-05-08 DIAGNOSIS — H04123 Dry eye syndrome of bilateral lacrimal glands: Secondary | ICD-10-CM | POA: Diagnosis not present

## 2018-05-08 DIAGNOSIS — H2513 Age-related nuclear cataract, bilateral: Secondary | ICD-10-CM | POA: Diagnosis not present

## 2018-05-08 DIAGNOSIS — H11153 Pinguecula, bilateral: Secondary | ICD-10-CM | POA: Diagnosis not present

## 2018-06-07 IMAGING — MG 2D DIGITAL DIAGNOSTIC BILATERAL MAMMOGRAM WITH CAD AND ADJUNCT T
8 of 13 series · 8 of 29 positions shown · non-contrast
Comparison: 12/26/2015, 11/22/2014, and earlier.

CLINICAL DATA: Malignant lumpectomy of the upper outer quadrant
right breast in November 2011. No further treatment at that
time.Annual evaluation.

EXAM:
2D DIGITAL DIAGNOSTIC BILATERAL MAMMOGRAM WITH CAD AND ADJUNCT TOMO

[R MLO (1 of 2)]
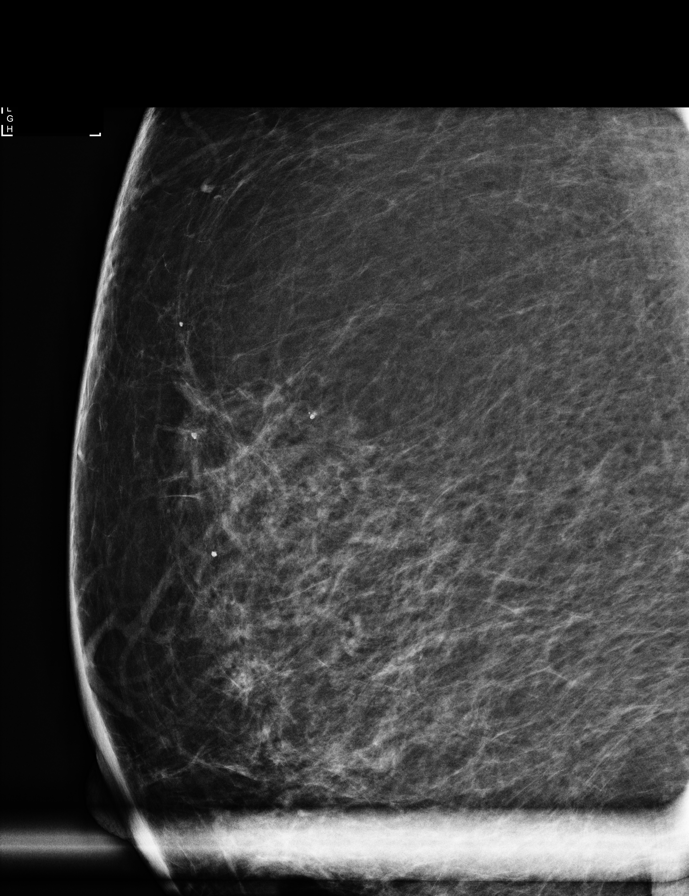

[L MLO synth-2D]
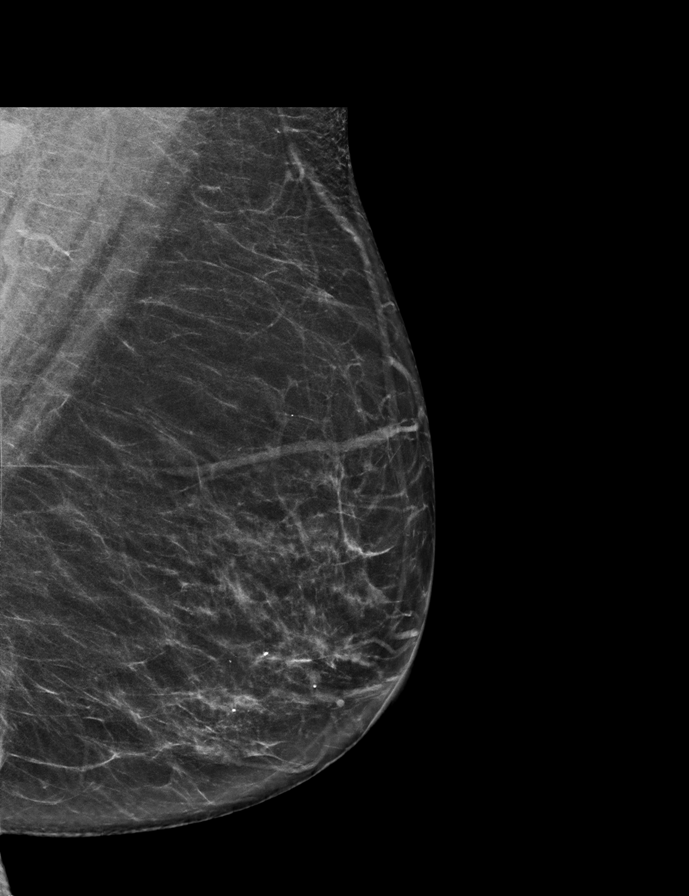

[R CC synth-2D]
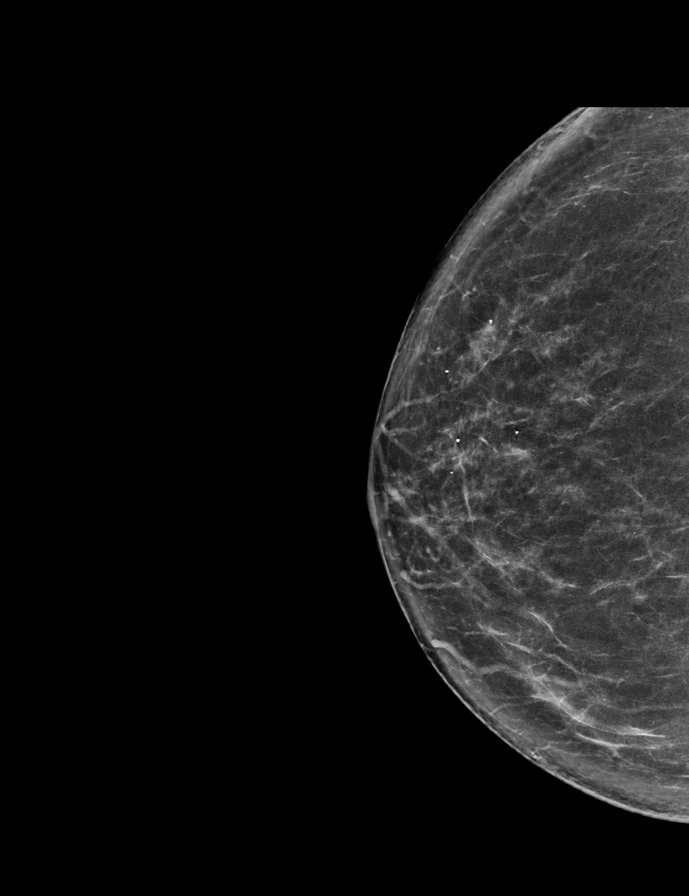

[L CC]
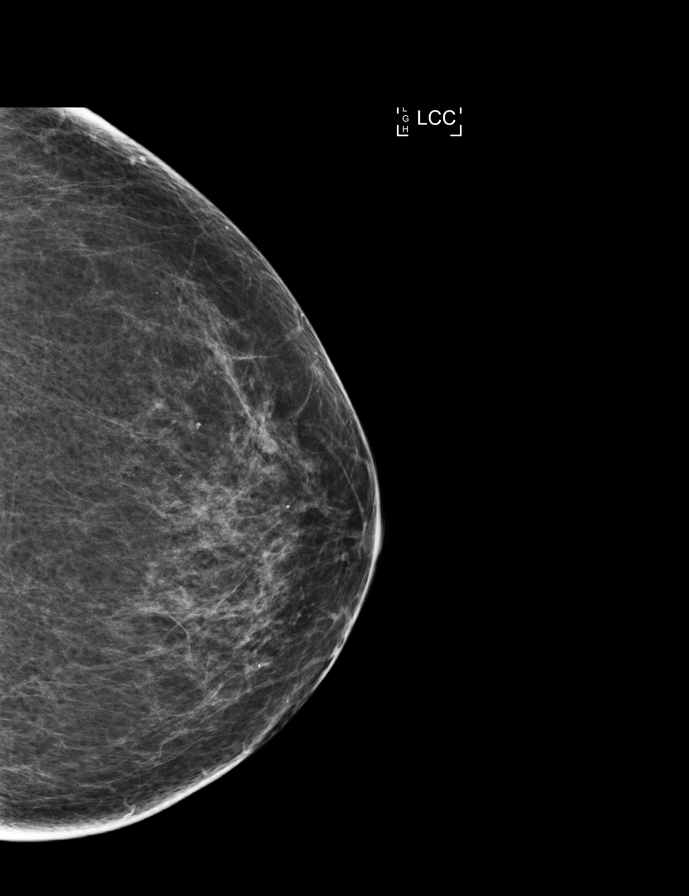

[R MLO (2 of 2)]
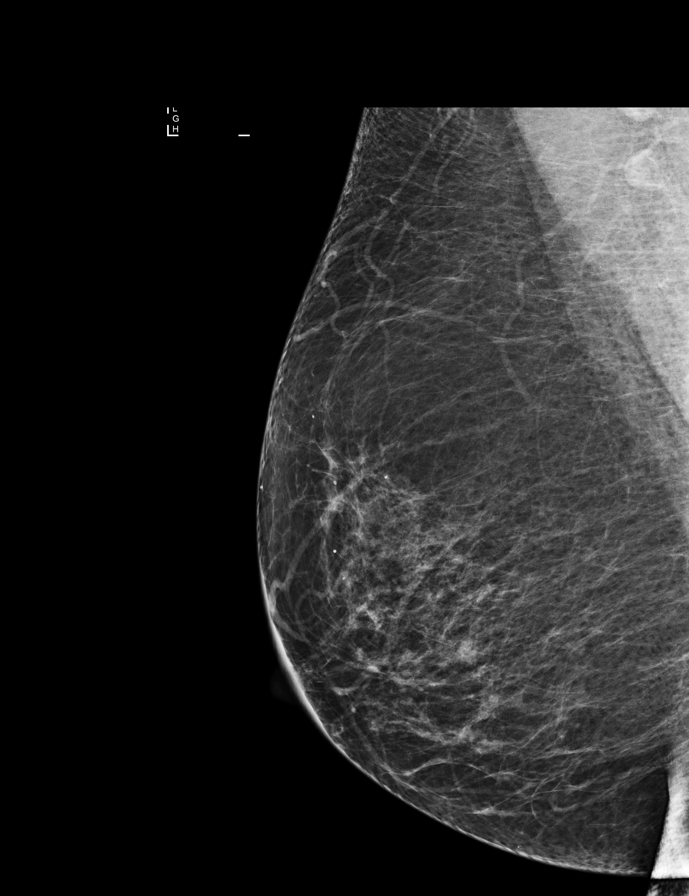

[L CC synth-2D]
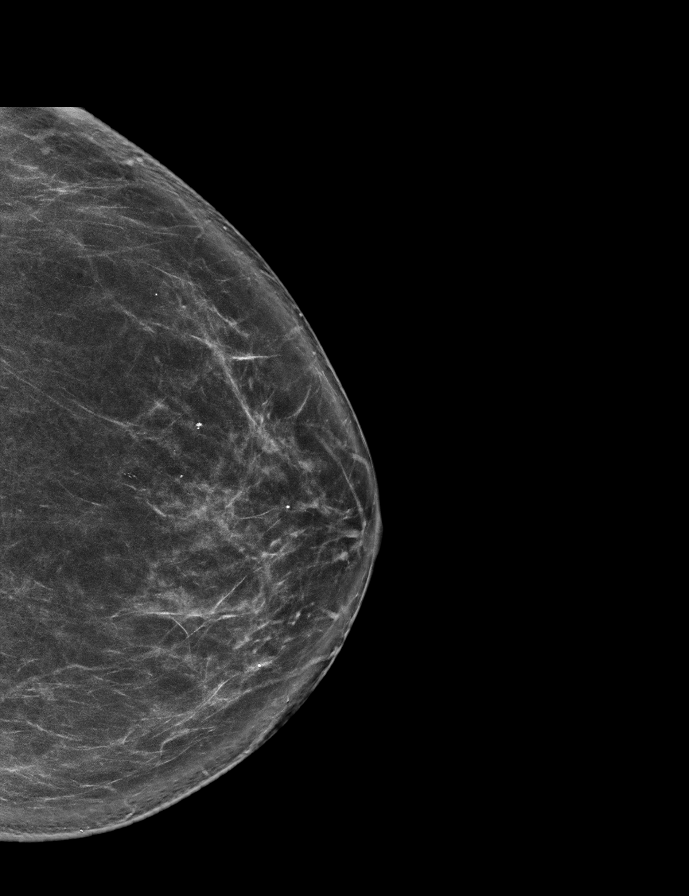

[R CC]
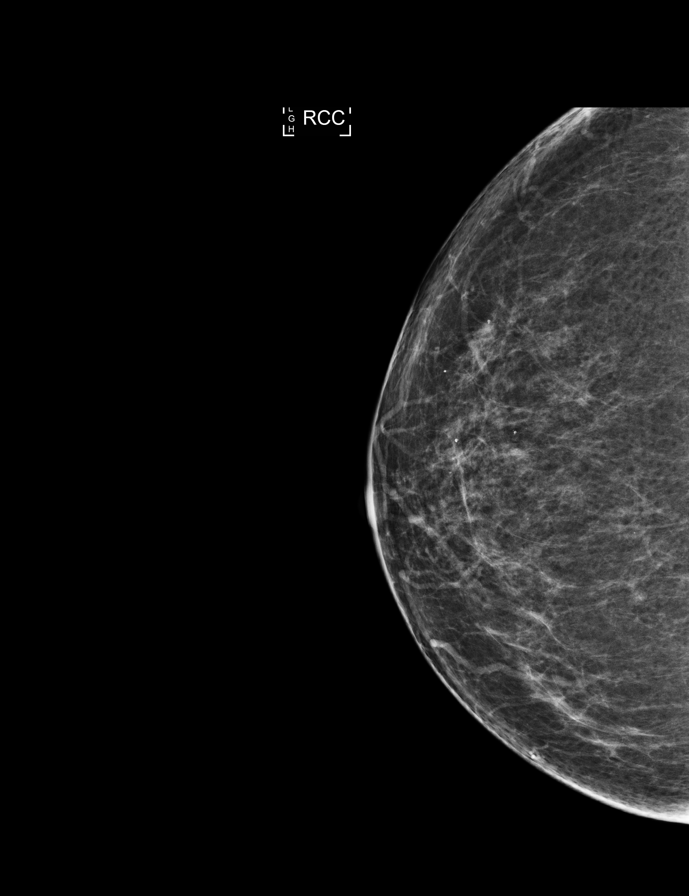

[R MLO synth-2D]
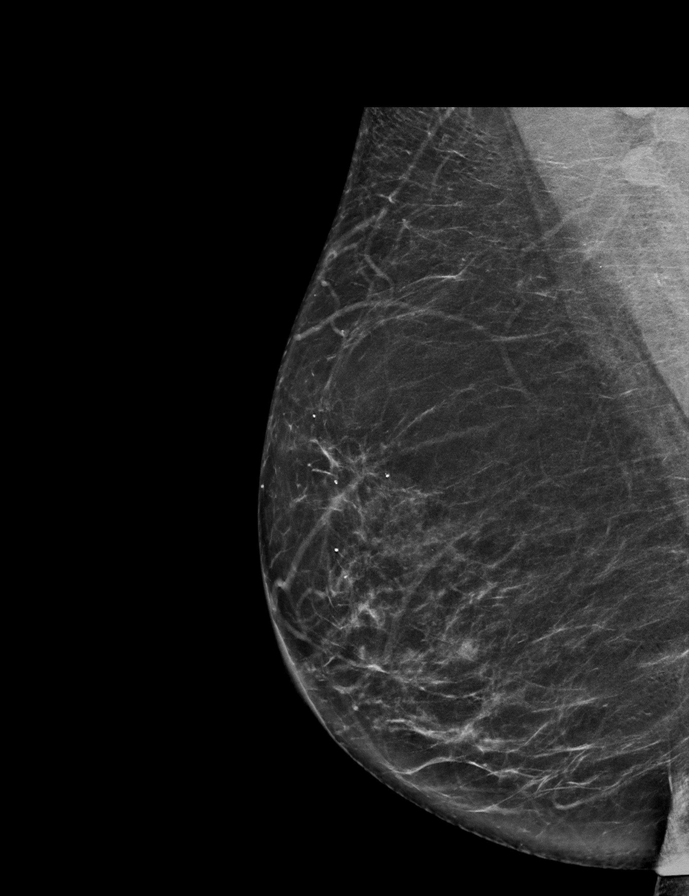

[8 of 29 positions shown; findings below may reference images not displayed]

ACR Breast Density Category b: There are scattered areas of
fibroglandular density.
FINDINGS: Standard 2D and tomosynthesis full field CC and MLO views of both
breasts were obtained. A standard spot magnification tangential view
of the lumpectomy site in the right breast was also obtained.

Post surgical scar/architectural distortion at the lumpectomy site
in the upper outer right breast at middle depth. No new or
suspicious findings in the right breast.

No findings suspicious for malignancy in the left breast.

Mammographic images were processed with CAD.
IMPRESSION: No mammographic evidence of malignancy involving either breast.
Expected post lumpectomy changes in the right breast.

RECOMMENDATION:
Bilateral diagnostic mammography in 1 year.

I have discussed the findings and recommendations with the patient.
Results were also provided in writing at the conclusion of the
visit. If applicable, a reminder letter will be sent to the patient
regarding the next appointment.

BI-RADS CATEGORY  2: Benign.

## 2018-08-28 DIAGNOSIS — E785 Hyperlipidemia, unspecified: Secondary | ICD-10-CM | POA: Diagnosis not present

## 2018-08-28 DIAGNOSIS — R7303 Prediabetes: Secondary | ICD-10-CM | POA: Diagnosis not present

## 2018-08-28 DIAGNOSIS — E559 Vitamin D deficiency, unspecified: Secondary | ICD-10-CM | POA: Diagnosis not present

## 2018-08-28 DIAGNOSIS — F17200 Nicotine dependence, unspecified, uncomplicated: Secondary | ICD-10-CM | POA: Diagnosis not present

## 2018-08-28 DIAGNOSIS — Z0000 Encounter for general adult medical examination without abnormal findings: Secondary | ICD-10-CM | POA: Diagnosis not present

## 2018-09-11 DIAGNOSIS — R7303 Prediabetes: Secondary | ICD-10-CM | POA: Diagnosis not present

## 2018-09-11 DIAGNOSIS — E785 Hyperlipidemia, unspecified: Secondary | ICD-10-CM | POA: Diagnosis not present

## 2018-09-11 DIAGNOSIS — E559 Vitamin D deficiency, unspecified: Secondary | ICD-10-CM | POA: Diagnosis not present

## 2018-10-29 DIAGNOSIS — J069 Acute upper respiratory infection, unspecified: Secondary | ICD-10-CM | POA: Diagnosis not present

## 2018-10-29 DIAGNOSIS — J209 Acute bronchitis, unspecified: Secondary | ICD-10-CM | POA: Diagnosis not present

## 2018-10-29 DIAGNOSIS — J9801 Acute bronchospasm: Secondary | ICD-10-CM | POA: Diagnosis not present

## 2019-01-24 DIAGNOSIS — N390 Urinary tract infection, site not specified: Secondary | ICD-10-CM | POA: Diagnosis not present

## 2019-01-24 DIAGNOSIS — M8588 Other specified disorders of bone density and structure, other site: Secondary | ICD-10-CM | POA: Diagnosis not present

## 2019-01-24 DIAGNOSIS — Z01419 Encounter for gynecological examination (general) (routine) without abnormal findings: Secondary | ICD-10-CM | POA: Diagnosis not present

## 2019-01-24 DIAGNOSIS — Z6827 Body mass index (BMI) 27.0-27.9, adult: Secondary | ICD-10-CM | POA: Diagnosis not present

## 2019-01-24 DIAGNOSIS — F419 Anxiety disorder, unspecified: Secondary | ICD-10-CM | POA: Diagnosis not present

## 2019-01-24 DIAGNOSIS — N958 Other specified menopausal and perimenopausal disorders: Secondary | ICD-10-CM | POA: Diagnosis not present

## 2019-01-24 DIAGNOSIS — Z1231 Encounter for screening mammogram for malignant neoplasm of breast: Secondary | ICD-10-CM | POA: Diagnosis not present

## 2019-06-14 DIAGNOSIS — I10 Essential (primary) hypertension: Secondary | ICD-10-CM | POA: Diagnosis not present

## 2019-06-14 DIAGNOSIS — M7551 Bursitis of right shoulder: Secondary | ICD-10-CM | POA: Diagnosis not present

## 2019-06-14 DIAGNOSIS — R590 Localized enlarged lymph nodes: Secondary | ICD-10-CM | POA: Diagnosis not present

## 2019-06-14 DIAGNOSIS — Z23 Encounter for immunization: Secondary | ICD-10-CM | POA: Diagnosis not present

## 2019-06-15 DIAGNOSIS — R590 Localized enlarged lymph nodes: Secondary | ICD-10-CM | POA: Diagnosis not present

## 2019-06-15 DIAGNOSIS — R7303 Prediabetes: Secondary | ICD-10-CM | POA: Diagnosis not present

## 2019-06-15 DIAGNOSIS — F330 Major depressive disorder, recurrent, mild: Secondary | ICD-10-CM | POA: Diagnosis not present

## 2019-06-15 DIAGNOSIS — R599 Enlarged lymph nodes, unspecified: Secondary | ICD-10-CM | POA: Diagnosis not present

## 2019-08-03 DIAGNOSIS — E785 Hyperlipidemia, unspecified: Secondary | ICD-10-CM | POA: Diagnosis not present

## 2019-08-03 DIAGNOSIS — I10 Essential (primary) hypertension: Secondary | ICD-10-CM | POA: Diagnosis not present

## 2019-08-09 DIAGNOSIS — F330 Major depressive disorder, recurrent, mild: Secondary | ICD-10-CM | POA: Diagnosis not present

## 2019-08-09 DIAGNOSIS — Z0000 Encounter for general adult medical examination without abnormal findings: Secondary | ICD-10-CM | POA: Diagnosis not present

## 2019-08-09 DIAGNOSIS — Z23 Encounter for immunization: Secondary | ICD-10-CM | POA: Diagnosis not present

## 2019-12-25 DIAGNOSIS — E7800 Pure hypercholesterolemia, unspecified: Secondary | ICD-10-CM | POA: Diagnosis not present

## 2019-12-25 DIAGNOSIS — T148XXA Other injury of unspecified body region, initial encounter: Secondary | ICD-10-CM | POA: Diagnosis not present

## 2019-12-25 DIAGNOSIS — I10 Essential (primary) hypertension: Secondary | ICD-10-CM | POA: Diagnosis not present

## 2020-02-18 DIAGNOSIS — Z1231 Encounter for screening mammogram for malignant neoplasm of breast: Secondary | ICD-10-CM | POA: Diagnosis not present

## 2020-02-18 DIAGNOSIS — F419 Anxiety disorder, unspecified: Secondary | ICD-10-CM | POA: Diagnosis not present

## 2020-02-18 DIAGNOSIS — Z01419 Encounter for gynecological examination (general) (routine) without abnormal findings: Secondary | ICD-10-CM | POA: Diagnosis not present

## 2020-02-18 DIAGNOSIS — Z6826 Body mass index (BMI) 26.0-26.9, adult: Secondary | ICD-10-CM | POA: Diagnosis not present

## 2020-08-26 DIAGNOSIS — E785 Hyperlipidemia, unspecified: Secondary | ICD-10-CM | POA: Diagnosis not present

## 2020-08-26 DIAGNOSIS — E559 Vitamin D deficiency, unspecified: Secondary | ICD-10-CM | POA: Diagnosis not present

## 2020-08-26 DIAGNOSIS — F330 Major depressive disorder, recurrent, mild: Secondary | ICD-10-CM | POA: Diagnosis not present

## 2020-08-26 DIAGNOSIS — I10 Essential (primary) hypertension: Secondary | ICD-10-CM | POA: Diagnosis not present

## 2020-08-26 DIAGNOSIS — Z0000 Encounter for general adult medical examination without abnormal findings: Secondary | ICD-10-CM | POA: Diagnosis not present

## 2020-09-05 DIAGNOSIS — F17210 Nicotine dependence, cigarettes, uncomplicated: Secondary | ICD-10-CM | POA: Diagnosis not present

## 2020-09-05 DIAGNOSIS — R7303 Prediabetes: Secondary | ICD-10-CM | POA: Diagnosis not present

## 2020-09-05 DIAGNOSIS — Z79899 Other long term (current) drug therapy: Secondary | ICD-10-CM | POA: Diagnosis not present

## 2020-09-05 DIAGNOSIS — I10 Essential (primary) hypertension: Secondary | ICD-10-CM | POA: Diagnosis not present

## 2020-09-05 DIAGNOSIS — Z0001 Encounter for general adult medical examination with abnormal findings: Secondary | ICD-10-CM | POA: Diagnosis not present

## 2020-09-05 DIAGNOSIS — E785 Hyperlipidemia, unspecified: Secondary | ICD-10-CM | POA: Diagnosis not present

## 2020-09-05 DIAGNOSIS — E559 Vitamin D deficiency, unspecified: Secondary | ICD-10-CM | POA: Diagnosis not present

## 2020-10-31 DIAGNOSIS — L739 Follicular disorder, unspecified: Secondary | ICD-10-CM | POA: Diagnosis not present

## 2021-03-23 DIAGNOSIS — N958 Other specified menopausal and perimenopausal disorders: Secondary | ICD-10-CM | POA: Diagnosis not present

## 2021-03-23 DIAGNOSIS — Z01419 Encounter for gynecological examination (general) (routine) without abnormal findings: Secondary | ICD-10-CM | POA: Diagnosis not present

## 2021-03-23 DIAGNOSIS — Z124 Encounter for screening for malignant neoplasm of cervix: Secondary | ICD-10-CM | POA: Diagnosis not present

## 2021-03-23 DIAGNOSIS — M8588 Other specified disorders of bone density and structure, other site: Secondary | ICD-10-CM | POA: Diagnosis not present

## 2021-03-23 DIAGNOSIS — G4700 Insomnia, unspecified: Secondary | ICD-10-CM | POA: Diagnosis not present

## 2021-03-23 DIAGNOSIS — Z1231 Encounter for screening mammogram for malignant neoplasm of breast: Secondary | ICD-10-CM | POA: Diagnosis not present

## 2021-05-18 DIAGNOSIS — M530 Cervicocranial syndrome: Secondary | ICD-10-CM | POA: Diagnosis not present

## 2021-05-18 DIAGNOSIS — M9901 Segmental and somatic dysfunction of cervical region: Secondary | ICD-10-CM | POA: Diagnosis not present

## 2021-05-20 DIAGNOSIS — M9901 Segmental and somatic dysfunction of cervical region: Secondary | ICD-10-CM | POA: Diagnosis not present

## 2021-05-20 DIAGNOSIS — M530 Cervicocranial syndrome: Secondary | ICD-10-CM | POA: Diagnosis not present

## 2021-05-25 DIAGNOSIS — M9901 Segmental and somatic dysfunction of cervical region: Secondary | ICD-10-CM | POA: Diagnosis not present

## 2021-05-25 DIAGNOSIS — M530 Cervicocranial syndrome: Secondary | ICD-10-CM | POA: Diagnosis not present

## 2021-05-27 DIAGNOSIS — M530 Cervicocranial syndrome: Secondary | ICD-10-CM | POA: Diagnosis not present

## 2021-05-27 DIAGNOSIS — M9901 Segmental and somatic dysfunction of cervical region: Secondary | ICD-10-CM | POA: Diagnosis not present

## 2021-06-02 DIAGNOSIS — M9901 Segmental and somatic dysfunction of cervical region: Secondary | ICD-10-CM | POA: Diagnosis not present

## 2021-06-02 DIAGNOSIS — M530 Cervicocranial syndrome: Secondary | ICD-10-CM | POA: Diagnosis not present

## 2021-06-08 DIAGNOSIS — M530 Cervicocranial syndrome: Secondary | ICD-10-CM | POA: Diagnosis not present

## 2021-06-08 DIAGNOSIS — M9901 Segmental and somatic dysfunction of cervical region: Secondary | ICD-10-CM | POA: Diagnosis not present

## 2021-06-10 DIAGNOSIS — M9901 Segmental and somatic dysfunction of cervical region: Secondary | ICD-10-CM | POA: Diagnosis not present

## 2021-06-10 DIAGNOSIS — M530 Cervicocranial syndrome: Secondary | ICD-10-CM | POA: Diagnosis not present

## 2021-06-15 DIAGNOSIS — M9901 Segmental and somatic dysfunction of cervical region: Secondary | ICD-10-CM | POA: Diagnosis not present

## 2021-06-15 DIAGNOSIS — M530 Cervicocranial syndrome: Secondary | ICD-10-CM | POA: Diagnosis not present

## 2021-06-19 DIAGNOSIS — M530 Cervicocranial syndrome: Secondary | ICD-10-CM | POA: Diagnosis not present

## 2021-06-19 DIAGNOSIS — M9901 Segmental and somatic dysfunction of cervical region: Secondary | ICD-10-CM | POA: Diagnosis not present

## 2021-07-20 DIAGNOSIS — M9901 Segmental and somatic dysfunction of cervical region: Secondary | ICD-10-CM | POA: Diagnosis not present

## 2021-07-20 DIAGNOSIS — M530 Cervicocranial syndrome: Secondary | ICD-10-CM | POA: Diagnosis not present

## 2021-07-27 DIAGNOSIS — M530 Cervicocranial syndrome: Secondary | ICD-10-CM | POA: Diagnosis not present

## 2021-07-27 DIAGNOSIS — M9901 Segmental and somatic dysfunction of cervical region: Secondary | ICD-10-CM | POA: Diagnosis not present

## 2021-08-26 DIAGNOSIS — Z23 Encounter for immunization: Secondary | ICD-10-CM | POA: Diagnosis not present

## 2021-08-26 DIAGNOSIS — I10 Essential (primary) hypertension: Secondary | ICD-10-CM | POA: Diagnosis not present

## 2021-08-26 DIAGNOSIS — E782 Mixed hyperlipidemia: Secondary | ICD-10-CM | POA: Diagnosis not present

## 2021-08-26 DIAGNOSIS — Z0000 Encounter for general adult medical examination without abnormal findings: Secondary | ICD-10-CM | POA: Diagnosis not present

## 2021-08-26 DIAGNOSIS — F330 Major depressive disorder, recurrent, mild: Secondary | ICD-10-CM | POA: Diagnosis not present

## 2021-08-26 DIAGNOSIS — R7303 Prediabetes: Secondary | ICD-10-CM | POA: Diagnosis not present

## 2021-08-26 DIAGNOSIS — N289 Disorder of kidney and ureter, unspecified: Secondary | ICD-10-CM | POA: Diagnosis not present

## 2024-05-29 NOTE — Progress Notes (Signed)
 Roster attribution reviewed and is noted to be incorrect. Instructed patient to contact insurance provider to notify them of inaccuracy.    PCP Attribution Review: Patient is no longer attributed to [facility] and has a non-[facility] PCP. Epic Care Team updated.  Chart Routed : PCP Review Needed - No

## 2024-05-31 ENCOUNTER — Emergency Department (HOSPITAL_BASED_OUTPATIENT_CLINIC_OR_DEPARTMENT_OTHER)
Admission: EM | Admit: 2024-05-31 | Discharge: 2024-05-31 | Disposition: A | Discharge status: Home / Self Care | Attending: Emergency Medicine | Admitting: Emergency Medicine

## 2024-05-31 ENCOUNTER — Encounter (HOSPITAL_BASED_OUTPATIENT_CLINIC_OR_DEPARTMENT_OTHER): Payer: Self-pay

## 2024-05-31 DIAGNOSIS — E876 Hypokalemia: Secondary | ICD-10-CM | POA: Insufficient documentation

## 2024-05-31 LAB — CBC WITH DIFFERENTIAL/PLATELET
Abs Immature Granulocytes: 0.01 K/uL (ref 0.00–0.07)
Basophils Absolute: 0 K/uL (ref 0.0–0.1)
Basophils Relative: 0 %
Eosinophils Absolute: 0.1 K/uL (ref 0.0–0.5)
Eosinophils Relative: 2 %
HCT: 34.5 % — ABNORMAL LOW (ref 36.0–46.0)
Hemoglobin: 11.9 g/dL — ABNORMAL LOW (ref 12.0–15.0)
Immature Granulocytes: 0 %
Lymphocytes Relative: 32 %
Lymphs Abs: 1.8 K/uL (ref 0.7–4.0)
MCH: 31.6 pg (ref 26.0–34.0)
MCHC: 34.5 g/dL (ref 30.0–36.0)
MCV: 91.5 fL (ref 80.0–100.0)
Monocytes Absolute: 0.4 K/uL (ref 0.1–1.0)
Monocytes Relative: 7 %
Neutro Abs: 3.4 K/uL (ref 1.7–7.7)
Neutrophils Relative %: 59 %
Platelets: 272 K/uL (ref 150–400)
RBC: 3.77 MIL/uL — ABNORMAL LOW (ref 3.87–5.11)
RDW: 12.1 % (ref 11.5–15.5)
WBC: 5.8 K/uL (ref 4.0–10.5)
nRBC: 0 % (ref 0.0–0.2)

## 2024-05-31 LAB — BASIC METABOLIC PANEL WITH GFR
Anion gap: 13 (ref 5–15)
BUN: 14 mg/dL (ref 8–23)
CO2: 25 mmol/L (ref 22–32)
Calcium: 8.9 mg/dL (ref 8.9–10.3)
Chloride: 103 mmol/L (ref 98–111)
Creatinine, Ser: 0.7 mg/dL (ref 0.44–1.00)
GFR, Estimated: >60 mL/min (ref >60)
Glucose, Bld: 125 mg/dL — ABNORMAL HIGH (ref 70–99)
Potassium: 3.2 mmol/L — ABNORMAL LOW (ref 3.5–5.1)
Sodium: 141 mmol/L (ref 135–145)

## 2024-05-31 LAB — MAGNESIUM: Magnesium: 0.7 mg/dL — CL (ref 1.7–2.4)

## 2024-05-31 MED ORDER — MAGNESIUM SULFATE 2 GM/50ML IV SOLN
2.0000 g | Freq: Once | INTRAVENOUS | Status: AC
  Administered 2024-05-31: 2 g via INTRAVENOUS
  Filled 2024-05-31: qty 50

## 2024-05-31 MED ORDER — POTASSIUM CHLORIDE CRYS ER 20 MEQ PO TBCR
40.0000 meq | EXTENDED_RELEASE_TABLET | Freq: Once | ORAL | Status: AC
  Administered 2024-05-31: 40 meq via ORAL
  Filled 2024-05-31: qty 2

## 2024-05-31 MED ORDER — POTASSIUM CHLORIDE CRYS ER 20 MEQ PO TBCR: 20.0000 meq | EXTENDED_RELEASE_TABLET | Freq: Two times a day (BID) | ORAL | 0 refills | Status: AC

## 2024-05-31 NOTE — ED Notes (Signed)
 Patient is resting comfortably.

## 2024-05-31 NOTE — ED Triage Notes (Signed)
 Pt had a checkup this am and got a call back this morning to come to the ER for critically low magnesium . Per pt, it was barely detectable. Pt is asymptomatic.

## 2024-05-31 NOTE — Progress Notes (Signed)
 Patient contacted and advised of providers recommendations, patient verbalized understanding.  Patient intends to seek ED now.

## 2024-05-31 NOTE — Discharge Instructions (Addendum)
 Start the new magnesium  supplement prescribed by your primary care doctor today.  Call them for follow-up for recheck of your magnesium .  If you start to develop arrhythmia/palpitations, seizures, muscle spasms or any other concerning symptoms please return for evaluation. ' Take potassium as prescribed as well.  This may help with your magnesium  uptake.

## 2024-05-31 NOTE — Progress Notes (Signed)
 [facility]  - INTERNAL MEDICINE [facility] Medicare Wellness Visit Type:: Subsequent Annual Wellness Visit  Name: Patricia Hale Date of Birth: 1950/11/26 Age: 73 y.o. MRN: [id number] Visit Date: 05/31/2024  History obtained from: patient  Living Arrangements/Support System/Health Assessment/Pain/Stress Marital status: divorced Number of children: 1 Occupation: semi-retired Living arrangements: lives with spouse/significant other Does the patient have a support system (family, friend, church, Conservation officer, nature, etc)?: Yes   Do you have any dental concerns?: No In the past month, have you experienced a change in your bladder control?: No   Do you have any difficulty obtaining your medications?: No   Do you have trouble consistently taking or remembering to take all of your medications as prescribed?: No Patient rates overall stress level as: Some Does stress affect daily life?: No Typical amount of pain: some Does pain affect daily life?: No Are you currently prescribed opioids?: No                Depression Screening  Behavioral Health Screening  Patient Health Questionnaire-2 Score: 0 (05/31/2024  8:30 AM)      Patient's Depression screening/score = Negative    Depression Plan: Normal/Negative Screening    Social History (Tobacco/Drugs/Sexual Activity) Patricia Hale reports that she quit smoking about 3 years ago. Her smoking use included cigarettes. She has never used smokeless tobacco. Tobacco Use?: No How many times in the past year have you used a recreational drug or used a prescription medication for nonmedical reasons?: None Risk factors for sexually transmitted infections (i.e., multiple sexual partners): No Are you bothered by sexual problems?: No  Alcohol Screening How often do you have a drink containing alcohol?: 4 or more times a week How many standard drinks containing alcohol do you have on a typical day?: Never, 1 or 2 drinks How  often do you have six or more drinks on one occasion?: Never Audit-C Score: 4  Physical Activity Regular exercise?: (!) No      Diet How many meals a day?: 3 Eats fruit and vegetables daily?: Yes Most meals are obtained by: preparing their own meals, eating out  Home and Transportation Safety All rugs have non-skid backing?: Yes All stairs or steps have railings?: N/A, no stairs Grab bars in the bathtub or shower?: Yes Have non-skid surface in bathtub or shower?: Yes Good home lighting?: Yes Regular seat belt use?: Yes      Activities of Daily Living Feed self?: Yes Bathe self?: Yes Dress self?: Yes Use toilet without assistance?: Yes Walk without assistance?: Yes    Instrumental Activities of Daily Living Manage finances?: Yes Shop for themselves?: Yes Prepare meals?: Yes Use the telephone?: Yes Manage medications?: Yes   Performs basic housework/laundry?: Yes Drives?: Yes Primary transportation is: driving  Hearing Concerns about hearing?: No Uses hearing aids?: No Hear whispered voice? (Observed): Yes  Vision Concerns about vision?: No Vision exam performed?: Yes  Fall Risk Is the patient ambulatory?: Yes One or more falls in the last year:: No Feels unsteady when walking:: No  Cognitive Assessment Has a diagnosis of dementia or cognitive impairment?: No Are there any memory concerns by the patient, others, or providers?: No              Advance Directives Living will?: Yes Advance directive information provided to patient: No Healthcare POA?: (!) No       Who is your in case of emergency contact?: Blakey,Nicole   Emergency contact's phone number: [phone]   Other History I  reviewed and updated the following risk factors and conditions as appropriate: Reviewed/Updated: Problem List, Medical History, Surgical History, Family History, Medications, Allergies Reviewed/Updated: Vital Signs (height, weight, and BP), Immunizations,  Health Maintenance Patient Care Team Updated: Done  Vital Signs BP 116/78   Pulse 81   Ht 1.588 m (5' 2.5)   Wt 68.7 kg (151 lb 8 oz)   SpO2 95%   BMI 27.27 kg/m   Screening and Immunizations Health Maintenance Status       Date Due Completion Dates   Hepatitis C Screening Never done ---   Breast Cancer Screening (Mammogram) 09/04/2022 09/04/2020, 02/19/2020 (Done)   Comment on 02/19/2020: See Legacy System   Comment on 02/18/2020: See Legacy System   COVID-19 Vaccine (3 - 2025-26 season) 05/28/2024 01/15/2020, 12/18/2019   Influenza Vaccine (1) 04/27/2024 06/27/2023, 09/02/2022   Diabetes Screening 09/15/2024 09/16/2023, 09/16/2023   Depression Monitoring 10/07/2024 04/06/2024   Comprehensive Annual Visit 05/31/2025 05/31/2024, 09/02/2022   Medicare Annual Wellness (AWV) Subsequent Visits 05/31/2025 05/31/2024, 09/02/2022   Adult RSV (60+ Years or Pregnancy) (1 - 1-dose 75+ series) 06/06/2025 ---   DTaP/Tdap/Td Vaccines (3 - Td or Tdap) 11/20/2026 11/20/2016, 03/11/2008   Colorectal Cancer Screening 08/17/2027 08/16/2022, 08/16/2022   Comment on 05/25/2017: See Legacy System       Immunization History  Administered Date(s) Administered  . Influenza, High-dose Seasonal, Quadrivalent, Preservative Free 08/01/2015, 06/14/2019, 08/26/2021, 09/02/2022  . Influenza, Injectable, Quadrivalent, Preservative Free 08/13/2016  . Influenza, Injectable, Quadrivalent, With Preservative 07/30/2014  . Influenza, Unspecified 07/30/2014, 07/06/2017  . Influenza, high-dose, trivalent, PF 08/01/2015, 07/21/2018, 07/15/2020, 06/27/2023  . Influenza, split virus, trivalent, preservative 07/06/2012  . Influenza,split virus, trivalent, PF 07/06/2012  . Moderna SARS-CoV-2 Primary Series 12+ yrs 12/18/2019, 01/15/2020  . Pneumococcal Conjugate 13-Valent 08/01/2015  . Pneumococcal Polysaccharide Vaccine, 23 Valent (PNEUMOVAX-23) 2Y+ 08/13/2016, 07/15/2020  . TDAP VACCINE (BOOSTRIX ,ADACEL) 7Y+ 03/11/2008,  11/20/2016  . Varicella Zoster [facility]) 18Y+ 08/10/2019, 10/17/2019   Social Drivers of Health   Living Situation: Low Risk  (09/14/2023)   Living Situation   . What is your living situation today?: I have a steady place to live   . Think about the place you live. Do you have problems with any of the following? Choose all that apply:: None/None on this list  Food Insecurity: Low Risk  (09/14/2023)   Food vital sign   . Within the past 12 months, you worried that your food would run out before you got money to buy more: Never true   . Within the past 12 months, the food you bought just didn't last and you didn't have money to get more: Never true  Transportation Needs: No Transportation Needs (09/14/2023)   Transportation   . In the past 12 months, has lack of reliable transportation kept you from medical appointments, meetings, work or from getting things needed for daily living? : No  Utilities: Low Risk  (09/14/2023)   Utilities   . In the past 12 months has the electric, gas, oil, or water company threatened to shut off services in your home? : No  Alcohol Screening: Alcohol Misuse (05/31/2024)   Alcohol   . Audit C Alcohol risk score: 4  Tobacco Use: Medium Risk (05/31/2024)   Patient History   . Smoking Tobacco Use: Former   . Smokeless Tobacco Use: Never   . Passive Exposure: Not on file  Depression: Not at risk (05/31/2024)   PHQ-2   . PHQ-2 Score: 0   Wt Readings from  Last 3 Encounters:  05/31/24 68.7 kg (151 lb 8 oz)  04/06/24 69.3 kg (152 lb 12.8 oz)  09/16/23 67.7 kg (149 lb 4.8 oz)   BP Readings from Last 3 Encounters:  05/31/24 116/78  04/06/24 130/87  09/16/23 124/79   Assessment/Plan: Subsequent Annual Wellness Visit: The topics above were reviewed with the patient.  Healthy lifestyle principles reviewed.  Recommendations provided when indicated.  Follow up 1 year for next wellness visit.  Orders Placed This Encounter  Procedures  . Magnesium   . Basic  Metabolic Panel  . Hemoglobin A1C With Estimated Average Glucose  . Hepatitis C Virus (HCV) Antibody Screen With Confirmation   Anxiety: -Chronic; stable and controlled on daily SSRI which she will continue. -Continue lexapro 10 mg daily. -She is prescribed xanax 0.5 mg daily prn; she will continue this rx seldomly prn for breakthrough anxiety (ie w/ travel). -DEA database is reviewed.  New Medications Ordered This Visit  Medications  . prednisoLONE acetate (PRED FORTE) 1 % ophthalmic suspension    Sig: INSTILL 1 DROP 4 TIMES DAILY INTO RIGHT EYE AS NEEDED. SHAKE WELL BEFORE USE  . ketorolac (ACULAR) 0.5 % ophthalmic solution    Sig: INSTILL 1 DROP INTO RIGHT EYE 4 TIMES DAILY AS DIRECTED  . gatifloxacin (ZYMAXID) 0.5 % drop    Sig: INSTILL 1 DROP INTO RIGHT EYE 4 TIMES DAILY AS DIRECTED. STORE UPSIDE DOWN  . magnesium  chloride (Slow-Mag) 71.5 mg TbEC tablet    Sig: Take 2 tablets (143 mg total) by mouth 2 (two) times a day.    Dispense:  360 tablet    Refill:  3  . escitalopram (LEXAPRO) 10 mg tablet    Sig: Take 1 tablet (10 mg total) by mouth daily.    Dispense:  90 tablet    Refill:  3  . atorvastatin (LIPITOR) 20 mg tablet    Sig: Take 1 tablet (20 mg total) by mouth daily.    Dispense:  90 tablet    Refill:  3  . amLODIPine (NORVASC) 2.5 mg tablet    Sig: Take 1 tablet (2.5 mg total) by mouth daily.    Dispense:  90 tablet    Refill:  3  . valsartan (DIOVAN) 160 mg tablet    Sig: Take 1 tablet (160 mg total) by mouth daily.    Dispense:  90 tablet    Refill:  3  . ALPRAZolam (XANAX) 0.5 mg tablet    Sig: Take 1 tablet (0.5 mg total) by mouth daily as needed for anxiety.    Dispense:  30 tablet    Refill:  1    Patient Care Team: Cole Christopher Podraza, PA-C as PCP - General Cole Christopher Podraza, PA-C as PCP - Attributed  Electronically signed by: Rosalva Lonni Catchings, PA-C 05/31/2024 9:31 AM

## 2024-05-31 NOTE — ED Provider Notes (Signed)
 [facility] Provider Note   CSN: [id number] Arrival date & time: 05/31/24  1253     Patient presents with: hypomagnesemia   Patricia Hale is a 73 y.o. female.   Patient was sent here for evaluation of low magnesium .  She is asymptomatic.  She has history of low magnesium  she is on magnesium  oxide supplements at home.  Her primary care doctor sent her in a new supplement to take but wanted her evaluated.  Her magnesium  was undetectable today at general wellness visit.  She has not had any nausea vomiting diarrhea.  No palpitations.  No confusion lethargy.  She feels at her baseline.  The history is provided by the patient.       Prior to Admission medications   Medication Sig Start Date End Date Taking? Authorizing Provider  amoxicillin  (AMOXIL ) 500 MG capsule Take 2 capsules (1,000 mg total) by mouth 2 (two) times daily. 09/21/16   Mabe, [place] CROME, MD  atorvastatin (LIPITOR) 10 MG tablet Take 10 mg by mouth daily.    [provider]  cephALEXin  (KEFLEX ) 500 MG capsule Take 1 capsule (500 mg total) by mouth 4 (four) times daily. 11/20/16   Annabell Vinie DASEN, PA-C  chlorpheniramine-HYDROcodone (TUSSIONEX PENNKINETIC ER) 10-8 MG/5ML SUER Take 5 mLs by mouth 2 (two) times daily. 09/21/16   Mabe, [place] CROME, MD  escitalopram (LEXAPRO) 10 MG tablet Take 15 mg by mouth daily.     [provider]  omeprazole (PRILOSEC) 40 MG capsule  01/07/14   [provider]    Allergies: Patient has no known allergies.    Review of Systems  Updated Vital Signs BP 130/69   Pulse 77   Temp 98.2 F (36.8 C) (Oral)   Resp 18   Ht 5' 2 (1.575 m)   Wt 68 kg   SpO2 97%   BMI 27.44 kg/m   Physical Exam Vitals and nursing note reviewed.  Constitutional:      General: She is not in acute distress.    Appearance: She is well-developed. She is not ill-appearing.  HENT:     Head: Normocephalic and atraumatic.     Nose: Nose  normal.     Mouth/Throat:     Mouth: Mucous membranes are moist.  Eyes:     Extraocular Movements: Extraocular movements intact.     Conjunctiva/sclera: Conjunctivae normal.     Pupils: Pupils are equal, round, and reactive to light.  Cardiovascular:     Rate and Rhythm: Normal rate and regular rhythm.     Pulses: Normal pulses.     Heart sounds: Normal heart sounds. No murmur heard. Pulmonary:     Effort: Pulmonary effort is normal. No respiratory distress.     Breath sounds: Normal breath sounds.  Abdominal:     General: Abdomen is flat.     Palpations: Abdomen is soft.     Tenderness: There is no abdominal tenderness.  Musculoskeletal:        General: No swelling.     Cervical back: Normal range of motion and neck supple.  Skin:    General: Skin is warm and dry.     Capillary Refill: Capillary refill takes less than 2 seconds.  Neurological:     General: No focal deficit present.     Mental Status: She is alert and oriented to person, place, and time.     Cranial Nerves: No cranial nerve deficit.     Sensory:  No sensory deficit.     Motor: No weakness.     Coordination: Coordination normal.     Comments: Normal strength normal sensation  Psychiatric:        Mood and Affect: Mood normal.     (all labs ordered are listed, but only abnormal results are displayed) Labs Reviewed  MAGNESIUM  - Abnormal; Notable for the following components:      Result Value   Magnesium  0.7 (*)    All other components within normal limits  CBC WITH DIFFERENTIAL/PLATELET - Abnormal; Notable for the following components:   RBC 3.77 (*)    Hemoglobin 11.9 (*)    HCT 34.5 (*)    All other components within normal limits  BASIC METABOLIC PANEL WITH GFR - Abnormal; Notable for the following components:   Potassium 3.2 (*)    Glucose, Bld 125 (*)    All other components within normal limits    EKG: EKG Interpretation Date/Time:  Thursday May 31 2024 13:13:46 EDT Ventricular Rate:   82 PR Interval:  207 QRS Duration:  90 QT Interval:  378 QTC Calculation: 442 R Axis:   3  Text Interpretation: Sinus rhythm Low voltage, precordial leads Confirmed by Ruthe Cornet [phone]) on 05/31/2024 2:22:26 PM  Radiology: No results found.   Procedures   Medications Ordered in the ED  magnesium  sulfate IVPB 2 g 50 mL (2 g Intravenous New Bag/Given 05/31/24 1325)                                    Medical Decision Making Amount and/or Complexity of Data Reviewed Labs: ordered.  Risk Prescription drug management.   Patricia Hale is here with low magnesium .  Looks like her magnesium  level was undetectable today at outpatient office.  She was there for general checkup.  She has history of low magnesium .  Sounds like her primary care doctor has sent in a new prescription for magnesium  supplementation orally.  She had previously been taking magnesium  oxide.  She has no palpitations muscle spasms confusion or any other symptoms concerning for hypomagnesia him.  She has a sinus rhythm on EKG.  Normal intervals.  Looks that her potassium level is normal today at 3.5 and then 3.2 on recheck.  Will recheck the labs today and include a CBC to see if there is any may be lab error.  Overall we will give her IV repletion with 2 g of magnesium  but given that she is asymptomatic is able to tolerate p.o. will have her continue her new supplement that was prescribed by her primary care doctor and follow-up next week for reevaluation.  Will have her continue potassium supplementation at home as well.  She was told to return to the ED if she develops any muscle spasms confusion palpitations seizures or any other concerning symptoms as we may need to replete the magnesium  quicker.  Overall these seem like she has some chronic low magnesium .  Her renal function is also normal.  She understands return precautions.  Discharged in good condition.  This chart was dictated using voice recognition software.   Despite best efforts to proofread,  errors can occur which can change the documentation meaning.      Final diagnoses:  Hypomagnesemia    ED Discharge Orders     None          Ruthe Cornet, DO 05/31/24 1513
